# Patient Record
Sex: Female | Born: 1955 | Race: Black or African American | Hispanic: No | Marital: Married | Smoking: Never smoker
Health system: Southern US, Community
[De-identification: ages and names within clinical notes are randomized; demographics above are authoritative.]

## PROBLEM LIST (undated history)

## (undated) DIAGNOSIS — I10 Essential (primary) hypertension: Secondary | ICD-10-CM

## (undated) DIAGNOSIS — R0602 Shortness of breath: Secondary | ICD-10-CM

## (undated) DIAGNOSIS — I499 Cardiac arrhythmia, unspecified: Secondary | ICD-10-CM

## (undated) DIAGNOSIS — R002 Palpitations: Secondary | ICD-10-CM

## (undated) DIAGNOSIS — E7800 Pure hypercholesterolemia, unspecified: Secondary | ICD-10-CM

## (undated) HISTORY — DX: Essential (primary) hypertension: I10

## (undated) HISTORY — DX: Pure hypercholesterolemia, unspecified: E78.00

## (undated) HISTORY — PX: OTHER SURGICAL HISTORY: SHX169

---

## 2010-04-21 HISTORY — PX: COLONOSCOPY: SHX174

## 2010-09-20 ENCOUNTER — Ambulatory Visit: Payer: Self-pay | Admitting: Unknown Physician Specialty

## 2010-09-24 LAB — PATHOLOGY REPORT

## 2012-02-26 LAB — HM PAP SMEAR

## 2012-07-07 ENCOUNTER — Encounter: Payer: Self-pay | Admitting: *Deleted

## 2012-07-07 ENCOUNTER — Ambulatory Visit (INDEPENDENT_AMBULATORY_CARE_PROVIDER_SITE_OTHER): Payer: BC Managed Care – PPO | Admitting: Internal Medicine

## 2012-07-07 ENCOUNTER — Encounter: Payer: Self-pay | Admitting: Internal Medicine

## 2012-07-07 VITALS — BP 140/90 | HR 81 | Temp 97.9°F | Ht 66.17 in | Wt 170.2 lb

## 2012-07-07 DIAGNOSIS — R030 Elevated blood-pressure reading, without diagnosis of hypertension: Secondary | ICD-10-CM

## 2012-07-07 DIAGNOSIS — R5381 Other malaise: Secondary | ICD-10-CM

## 2012-07-07 DIAGNOSIS — R5383 Other fatigue: Secondary | ICD-10-CM

## 2012-07-07 DIAGNOSIS — E7800 Pure hypercholesterolemia, unspecified: Secondary | ICD-10-CM

## 2012-07-07 DIAGNOSIS — IMO0001 Reserved for inherently not codable concepts without codable children: Secondary | ICD-10-CM

## 2012-07-07 DIAGNOSIS — I10 Essential (primary) hypertension: Secondary | ICD-10-CM

## 2012-07-07 LAB — CBC WITH DIFFERENTIAL/PLATELET
Basophils Absolute: 0 10*3/uL (ref 0.0–0.1)
Basophils Relative: 0.8 % (ref 0.0–3.0)
Eosinophils Absolute: 0.1 10*3/uL (ref 0.0–0.7)
Eosinophils Relative: 2.5 % (ref 0.0–5.0)
HCT: 40.7 % (ref 36.0–46.0)
Hemoglobin: 13.6 g/dL (ref 12.0–15.0)
Lymphocytes Relative: 40.9 % (ref 12.0–46.0)
Lymphs Abs: 1.6 10*3/uL (ref 0.7–4.0)
MCHC: 33.6 g/dL (ref 30.0–36.0)
MCV: 79.4 fl (ref 78.0–100.0)
Monocytes Absolute: 0.3 10*3/uL (ref 0.1–1.0)
Monocytes Relative: 8.6 % (ref 3.0–12.0)
Neutro Abs: 1.9 10*3/uL (ref 1.4–7.7)
Neutrophils Relative %: 47.2 % (ref 43.0–77.0)
Platelets: 342 10*3/uL (ref 150.0–400.0)
RBC: 5.12 Mil/uL — ABNORMAL HIGH (ref 3.87–5.11)
RDW: 13.9 % (ref 11.5–14.6)
WBC: 4 10*3/uL — ABNORMAL LOW (ref 4.5–10.5)

## 2012-07-07 LAB — LIPID PANEL
Cholesterol: 243 mg/dL — ABNORMAL HIGH (ref 0–200)
HDL: 56.1 mg/dL (ref >39.00)
Total CHOL/HDL Ratio: 4
Triglycerides: 227 mg/dL — ABNORMAL HIGH (ref 0.0–149.0)
VLDL: 45.4 mg/dL — ABNORMAL HIGH (ref 0.0–40.0)

## 2012-07-07 LAB — COMPREHENSIVE METABOLIC PANEL
ALT: 26 U/L (ref 0–35)
AST: 23 U/L (ref 0–37)
Albumin: 4.4 g/dL (ref 3.5–5.2)
Alkaline Phosphatase: 90 U/L (ref 39–117)
BUN: 8 mg/dL (ref 6–23)
CO2: 27 mEq/L (ref 19–32)
Calcium: 9.3 mg/dL (ref 8.4–10.5)
Chloride: 104 mEq/L (ref 96–112)
Creatinine, Ser: 0.8 mg/dL (ref 0.4–1.2)
GFR: 83.92 mL/min (ref >60.00)
Glucose, Bld: 108 mg/dL — ABNORMAL HIGH (ref 70–99)
Potassium: 3.5 mEq/L (ref 3.5–5.1)
Sodium: 139 mEq/L (ref 135–145)
Total Bilirubin: 0.7 mg/dL (ref 0.3–1.2)
Total Protein: 8 g/dL (ref 6.0–8.3)

## 2012-07-07 LAB — LDL CHOLESTEROL, DIRECT: Direct LDL: 138.3 mg/dL

## 2012-07-07 LAB — TSH: TSH: 1.33 u[IU]/mL (ref 0.35–5.50)

## 2012-07-08 ENCOUNTER — Encounter: Payer: Self-pay | Admitting: Internal Medicine

## 2012-07-08 DIAGNOSIS — E7800 Pure hypercholesterolemia, unspecified: Secondary | ICD-10-CM | POA: Insufficient documentation

## 2012-07-08 DIAGNOSIS — I10 Essential (primary) hypertension: Secondary | ICD-10-CM | POA: Insufficient documentation

## 2012-07-08 NOTE — Assessment & Plan Note (Signed)
Low cholesterol diet and exercise.  Check lipid panel.   

## 2012-07-08 NOTE — Progress Notes (Signed)
Subjective:    Patient ID: Cheryl Powers, female    DOB: [birth year], 57 y.o.   MRN: [id number]  HPI 57 year old female who comes in today to establish care.  She has been followed by Dr Harold Hedge for years.  Now seeing Dr Luella Cook.  Needs a PCP.  Has recently been told cholesterol is elevated and has had some issues with her blood pressure being elevated.  She works at [employer].  States her job is very stressful.  She was questioning if her blood pressure could be elevated because of her work situation.  She tries to stay active, but has not been doing any formal exercise.  Breathing stable. No chest pain or tightness with increased activity or exertion.  No acid reflux.  Bowels stable.  Recently saw Dr Purcell Nails for dermatitis - breast (nipple).  This has resolved.  She does have a "rash" around her neck.  No itching.  Up to date with her pelvic and pap smears (02/26/12).  Up to date with her mammograms (02/26/12).     Outpatient Encounter Prescriptions as of 07/07/2012  Medication Sig Dispense Refill  . Biotin 1000 MCG tablet Take 1,000 mcg by mouth daily.      . Cholecalciferol (VITAMIN D3) 1000 UNITS CAPS Take by mouth daily.      . Multiple Vitamin (MULTIVITAMIN) tablet Take 1 tablet by mouth daily.       No facility-administered encounter medications on file as of 07/07/2012.    Review of Systems Patient denies any headache, lightheadedness or dizziness.  No sinus or allergy symptoms.  No chest pain, tightness or palpitations.  No increased shortness of breath, cough or congestion.  No nausea or vomiting.  No acid reflux.  No abdominal pain or cramping.  No bowel change, such as diarrhea, constipation, BRBPR or melana.  No urine change.  No vaginal problems.  The previous breast dermatitis - improved.  "rash" noted on her neck.  Increased stress.  Discussed at length with her today.  Feels she is handling things relatively well.  Does not feel she needs any further intervention at this time.   Plans to start exercising.         Objective:   Physical Exam Filed Vitals:   07/07/12 0840  BP: 140/90  Pulse: 81  Temp: 97.9 F (39.59 C)   57 year old female in no acute distress.   HEENT:  Nares- clear.  Oropharynx - without lesions. NECK:  Supple.  Nontender.  No audible bruit.  HEART:  Appears to be regular. LUNGS:  No crackles or wheezing audible.  Respirations even and unlabored.  RADIAL PULSE:  Equal bilaterally.    ABDOMEN:  Soft, nontender.  Bowel sounds present and normal.  No audible abdominal bruit.   EXTREMITIES:  No increased edema present.  DP pulses palpable and equal bilaterally.      SKIN:  Rash noted around posterior neck and extending laterally.  Appears to be c/w acanthosis nigracans.   NEURO:  No focal neurological deficit identified.        Assessment & Plan:  DERMATOLOGY.  Neck - as outlined.  May try selsun shampoo.  Check sugar.    BREAST DERMATITIS.  Followed by Dr Purcell Nails.  Improved.    INCREASED PSYCHOSOCIAL STRESSORS.  Discussed at length with her today.  She does not feel she needs any further intervention.  Will follow closely.  Let me know if any change or problems.    GYN.  Up  to date with her breast, pelvics and pap smears.  Obtain records.  Plans to start having these done through this office.   FATIGUE.  Probably multifactorial.  Check cbc, met c and tsh.    HEALTH MAINTENANCE.  Had her physical in 11/13.  Mammogram 02/26/12 - ok per pt.  Pap 02/26/12 - ok per pt.  Had colonoscopy at age [age].  Obtain records.

## 2012-07-08 NOTE — Assessment & Plan Note (Signed)
Blood pressure elevated today and apparently has been elevated on a couple of checks recently.  Will have her spot check her pressure and record.  Get her back in soon to reassess.  If persistent elevation, will require medication.  Check metabolic panel.

## 2012-07-17 ENCOUNTER — Telehealth: Payer: Self-pay | Admitting: Internal Medicine

## 2012-07-17 DIAGNOSIS — D72819 Decreased white blood cell count, unspecified: Secondary | ICD-10-CM

## 2012-07-17 DIAGNOSIS — R739 Hyperglycemia, unspecified: Secondary | ICD-10-CM

## 2012-07-17 NOTE — Telephone Encounter (Signed)
Pt notified of labs and need for follow up sugar check.  Will check at her next appt in 4/14.  Will also recheck cbc.  Send [employer].

## 2012-08-10 ENCOUNTER — Ambulatory Visit (INDEPENDENT_AMBULATORY_CARE_PROVIDER_SITE_OTHER): Payer: BC Managed Care – PPO | Admitting: Internal Medicine

## 2012-08-10 ENCOUNTER — Encounter: Payer: Self-pay | Admitting: Internal Medicine

## 2012-08-10 VITALS — BP 122/82 | HR 87 | Temp 98.4°F | Ht 66.17 in | Wt 164.8 lb

## 2012-08-10 DIAGNOSIS — E7800 Pure hypercholesterolemia, unspecified: Secondary | ICD-10-CM

## 2012-08-10 DIAGNOSIS — E0789 Other specified disorders of thyroid: Secondary | ICD-10-CM

## 2012-08-10 DIAGNOSIS — R5381 Other malaise: Secondary | ICD-10-CM

## 2012-08-10 DIAGNOSIS — D72819 Decreased white blood cell count, unspecified: Secondary | ICD-10-CM

## 2012-08-10 DIAGNOSIS — R739 Hyperglycemia, unspecified: Secondary | ICD-10-CM

## 2012-08-10 DIAGNOSIS — IMO0001 Reserved for inherently not codable concepts without codable children: Secondary | ICD-10-CM

## 2012-08-10 DIAGNOSIS — R7309 Other abnormal glucose: Secondary | ICD-10-CM

## 2012-08-10 DIAGNOSIS — R030 Elevated blood-pressure reading, without diagnosis of hypertension: Secondary | ICD-10-CM

## 2012-08-10 LAB — CBC WITH DIFFERENTIAL/PLATELET
Basophils Absolute: 0 10*3/uL (ref 0.0–0.1)
Basophils Relative: 0.8 % (ref 0.0–3.0)
Eosinophils Absolute: 0.1 10*3/uL (ref 0.0–0.7)
Eosinophils Relative: 2.2 % (ref 0.0–5.0)
HCT: 40.1 % (ref 36.0–46.0)
Hemoglobin: 13.6 g/dL (ref 12.0–15.0)
Lymphocytes Relative: 41.3 % (ref 12.0–46.0)
Lymphs Abs: 1.7 10*3/uL (ref 0.7–4.0)
MCHC: 34 g/dL (ref 30.0–36.0)
MCV: 78.7 fl (ref 78.0–100.0)
Monocytes Absolute: 0.3 10*3/uL (ref 0.1–1.0)
Monocytes Relative: 7.3 % (ref 3.0–12.0)
Neutro Abs: 1.9 10*3/uL (ref 1.4–7.7)
Neutrophils Relative %: 48.4 % (ref 43.0–77.0)
Platelets: 328 10*3/uL (ref 150.0–400.0)
RBC: 5.09 Mil/uL (ref 3.87–5.11)
RDW: 12.9 % (ref 11.5–14.6)
WBC: 4 10*3/uL — ABNORMAL LOW (ref 4.5–10.5)

## 2012-08-10 LAB — GLUCOSE, RANDOM: Glucose, Bld: 100 mg/dL — ABNORMAL HIGH (ref 70–99)

## 2012-08-10 LAB — HEMOGLOBIN A1C: Hgb A1c MFr Bld: 6 % (ref 4.6–6.5)

## 2012-08-10 MED ORDER — CYANOCOBALAMIN 1000 MCG/ML IJ SOLN
1000.0000 ug | Freq: Once | INTRAMUSCULAR | Status: AC
  Administered 2012-08-10: 1000 ug via INTRAMUSCULAR

## 2012-08-16 ENCOUNTER — Ambulatory Visit: Payer: Self-pay | Admitting: Internal Medicine

## 2012-08-16 ENCOUNTER — Encounter: Payer: Self-pay | Admitting: Internal Medicine

## 2012-08-16 DIAGNOSIS — E0789 Other specified disorders of thyroid: Secondary | ICD-10-CM | POA: Insufficient documentation

## 2012-08-16 NOTE — Assessment & Plan Note (Signed)
Exam as outlined.  Check thyroid ultrasound.  Recent tsh wnl.

## 2012-08-16 NOTE — Progress Notes (Signed)
Subjective:    Patient ID: Cheryl Powers, female    DOB: 08-Sep-1955, 57 y.o.   MRN: [id number]  HPI 57 year old female who comes in today for a scheduled follow up.   She works at [employer].  Her job is very stressful.  Since her last visit, she has adjusted her diet.  Is losing weight and exercising.  Doing Zoomba and palates.  Also walking 30 minutes per day.  Breathing stable. No chest pain or tightness with increased activity or exertion.  No acid reflux.  Bowels stable.   Feels better.   She does have a "rash" around her neck.  No itching.  Up to date with her pelvic and pap smears (02/26/12).  Up to date with her mammograms (02/26/12).     Outpatient Encounter Prescriptions as of 08/10/2012  Medication Sig Dispense Refill  . Biotin 1000 MCG tablet Take 1,000 mcg by mouth every other day.       . Cholecalciferol (VITAMIN D3) 1000 UNITS CAPS Take by mouth daily.      . fish oil-omega-3 fatty acids 1000 MG capsule Take 2 g by mouth every other day.      . [DISCONTINUED] Multiple Vitamin (MULTIVITAMIN) tablet Take 1 tablet by mouth daily.      . [EXPIRED] cyanocobalamin ((VITAMIN B-12)) injection 1,000 mcg        No facility-administered encounter medications on file as of 08/10/2012.    Current Outpatient Prescriptions on File Prior to Visit  Medication Sig Dispense Refill  . Biotin 1000 MCG tablet Take 1,000 mcg by mouth every other day.       . Cholecalciferol (VITAMIN D3) 1000 UNITS CAPS Take by mouth daily.       No current facility-administered medications on file prior to visit.    Review of Systems Patient denies any headache, lightheadedness or dizziness.  No sinus or allergy symptoms.  No chest pain, tightness or palpitations.  No increased shortness of breath, cough or congestion.  No nausea or vomiting.  No acid reflux.  No abdominal pain or cramping.  No bowel change, such as diarrhea, constipation, BRBPR or melana.  No urine change.  No vaginal problems.   "Rash" noted on  her neck.  Has not tried selsun.  Increased stress.  Feels she is handling things relatively well.  Does not feel she needs any further intervention at this time.  Exercising has helped.         Objective:   Physical Exam  Filed Vitals:   08/10/12 0806  BP: 122/82  Pulse: 87  Temp: 98.4 F (36.9 C)   Blood pressure recheck:  32/14  57 year old female in no acute distress.   HEENT:  Nares- clear.  Oropharynx - without lesions. NECK:  Supple.  Nontender.  No audible bruit.  Increased fullness - thyroid.    HEART:  Appears to be regular. LUNGS:  No crackles or wheezing audible.  Respirations even and unlabored.  RADIAL PULSE:  Equal bilaterally.    ABDOMEN:  Soft, nontender.  Bowel sounds present and normal.  No audible abdominal bruit.   EXTREMITIES:  No increased edema present.  DP pulses palpable and equal bilaterally.      SKIN:  Rash noted around posterior neck and extending laterally.  Appears to be c/w acanthosis nigracans.       Assessment & Plan:  DERMATOLOGY.  Neck - as outlined.  May try selsun shampoo.  If persistent, will refer back to dermatology.  BREAST DERMATITIS.  Followed by Dr Purcell Nails.  Improved.    INCREASED PSYCHOSOCIAL STRESSORS.  She does not feel she needs any further intervention.  Since exercising, feels better.  Follow.   GYN.  Up to date with her breast, pelvics and pap smears.  Pap negative 02/26/12.  Mammogram 02/26/12 - negative.   HEALTH MAINTENANCE.  Had her physical in 11/13.  Mammogram 02/26/12 - negative.  Pap 02/26/12 - ok per pt.  Had colonoscopy at age 98-53.  Need records.

## 2012-08-16 NOTE — Assessment & Plan Note (Signed)
Low cholesterol diet and exercise.  Follow lipid panel.   

## 2012-08-16 NOTE — Assessment & Plan Note (Signed)
Blood pressure better.  Follow. Has adjusted her diet.  Exercising.  Losing weight.  Follow.

## 2012-08-31 ENCOUNTER — Telehealth: Payer: Self-pay | Admitting: Internal Medicine

## 2012-08-31 DIAGNOSIS — E041 Nontoxic single thyroid nodule: Secondary | ICD-10-CM

## 2012-08-31 NOTE — Telephone Encounter (Signed)
Had tried multiple times to get in touch with pt.  She was notified today of thyroid ultrasound results and need for referral to endocrinology (Dr Renae Fickle ) for evaluation and question of need for biopsy.  Order for referral made

## 2012-08-31 NOTE — Telephone Encounter (Signed)
Patient wanting to know the results of her thyroid test. She is asking for a call from the physician.

## 2012-09-23 ENCOUNTER — Encounter: Payer: Self-pay | Admitting: Internal Medicine

## 2012-10-11 ENCOUNTER — Ambulatory Visit: Payer: BC Managed Care – PPO | Admitting: Internal Medicine

## 2013-05-23 ENCOUNTER — Emergency Department: Payer: Self-pay | Admitting: Emergency Medicine

## 2013-05-23 LAB — CBC
HCT: 41.1 % (ref 35.0–47.0)
HGB: 13.6 g/dL (ref 12.0–16.0)
MCH: 26.6 pg (ref 26.0–34.0)
MCHC: 33.1 g/dL (ref 32.0–36.0)
MCV: 80 fL (ref 80–100)
Platelet: 295 10*3/uL (ref 150–440)
RBC: 5.11 10*6/uL (ref 3.80–5.20)
RDW: 12.9 % (ref 11.5–14.5)
WBC: 5.5 10*3/uL (ref 3.6–11.0)

## 2013-05-23 LAB — COMPREHENSIVE METABOLIC PANEL
Albumin: 3.9 g/dL (ref 3.4–5.0)
Alkaline Phosphatase: 110 U/L
Anion Gap: 6 — ABNORMAL LOW (ref 7–16)
BUN: 15 mg/dL (ref 7–18)
Bilirubin,Total: 0.2 mg/dL (ref 0.2–1.0)
Calcium, Total: 8.8 mg/dL (ref 8.5–10.1)
Chloride: 104 mmol/L (ref 98–107)
Co2: 28 mmol/L (ref 21–32)
Creatinine: 0.85 mg/dL (ref 0.60–1.30)
EGFR (African American): >60
EGFR (Non-African Amer.): >60
Glucose: 129 mg/dL — ABNORMAL HIGH (ref 65–99)
Osmolality: 278 (ref 275–301)
Potassium: 3 mmol/L — ABNORMAL LOW (ref 3.5–5.1)
SGOT(AST): 26 U/L (ref 15–37)
SGPT (ALT): 27 U/L (ref 12–78)
Sodium: 138 mmol/L (ref 136–145)
Total Protein: 8.1 g/dL (ref 6.4–8.2)

## 2013-05-23 LAB — ETHANOL
Ethanol %: <0.003 % (ref 0.000–0.080)
Ethanol: <3 mg/dL

## 2013-05-23 LAB — URINALYSIS, COMPLETE
Bilirubin,UR: NEGATIVE
Blood: NEGATIVE
Glucose,UR: NEGATIVE mg/dL (ref 0–75)
Ketone: NEGATIVE
Nitrite: NEGATIVE
Ph: 7 (ref 4.5–8.0)
Protein: NEGATIVE
RBC,UR: NONE SEEN /HPF (ref 0–5)
Specific Gravity: 1.008 (ref 1.003–1.030)
Squamous Epithelial: <1
WBC UR: 10 /HPF (ref 0–5)

## 2013-05-23 LAB — DRUG SCREEN, URINE

## 2013-05-23 LAB — CK TOTAL AND CKMB (NOT AT ARMC)
CK, Total: 178 U/L (ref 21–215)
CK-MB: 1.5 ng/mL (ref 0.5–3.6)

## 2013-05-23 LAB — LIPASE, BLOOD: Lipase: 213 U/L (ref 73–393)

## 2013-05-23 LAB — TSH: Thyroid Stimulating Horm: 3.38 u[IU]/mL

## 2013-05-23 LAB — TROPONIN I: Troponin-I: <0.02 ng/mL

## 2013-05-25 ENCOUNTER — Ambulatory Visit (INDEPENDENT_AMBULATORY_CARE_PROVIDER_SITE_OTHER): Payer: BC Managed Care – PPO | Admitting: Internal Medicine

## 2013-05-25 ENCOUNTER — Encounter: Payer: Self-pay | Admitting: Internal Medicine

## 2013-05-25 VITALS — BP 140/100 | HR 84 | Temp 98.7°F | Ht 66.17 in | Wt 152.0 lb

## 2013-05-25 DIAGNOSIS — E876 Hypokalemia: Secondary | ICD-10-CM

## 2013-05-25 DIAGNOSIS — I10 Essential (primary) hypertension: Secondary | ICD-10-CM

## 2013-05-25 DIAGNOSIS — N390 Urinary tract infection, site not specified: Secondary | ICD-10-CM

## 2013-05-25 DIAGNOSIS — E7800 Pure hypercholesterolemia, unspecified: Secondary | ICD-10-CM

## 2013-05-25 LAB — POTASSIUM: Potassium: 4.1 mEq/L (ref 3.5–5.1)

## 2013-05-25 LAB — CREATININE, SERUM: Creatinine, Ser: 1.2 mg/dL (ref 0.4–1.2)

## 2013-05-25 MED ORDER — FLUTICASONE PROPIONATE 50 MCG/ACT NA SUSP: 2.0000 | Freq: Every day | NASAL | Status: DC

## 2013-05-25 MED ORDER — LISINOPRIL 10 MG PO TABS: 10.0000 mg | ORAL_TABLET | Freq: Every day | ORAL | Status: DC

## 2013-05-25 NOTE — Progress Notes (Signed)
Pre-visit discussion using our clinic review tool. No additional management support is needed unless otherwise documented below in the visit note.  

## 2013-05-25 NOTE — Patient Instructions (Signed)
Saline nasal spray - flush nose at least 2-3x/day.  Flonase nasal spray - two sprays each nostril one time per day (do this in the evening).    Start lisinopril 10mg  - one per day.    Stop the advil cold and sinus.    Take align one per day (probiotic)

## 2013-05-26 ENCOUNTER — Other Ambulatory Visit: Payer: Self-pay | Admitting: Internal Medicine

## 2013-05-26 DIAGNOSIS — I10 Essential (primary) hypertension: Secondary | ICD-10-CM

## 2013-05-26 NOTE — Progress Notes (Signed)
Order placed for f/u met b.  

## 2013-05-30 ENCOUNTER — Encounter: Payer: Self-pay | Admitting: Internal Medicine

## 2013-05-30 DIAGNOSIS — N289 Disorder of kidney and ureter, unspecified: Secondary | ICD-10-CM | POA: Insufficient documentation

## 2013-05-30 NOTE — Assessment & Plan Note (Signed)
Blood pressure remaining elevated.  Start lisinopril 10mg  q day.  Check metabolic panel in two weeks.  Follow pressures.

## 2013-05-30 NOTE — Assessment & Plan Note (Signed)
On cipro for uti.  Symptoms have resolved.  Follow.

## 2013-05-30 NOTE — Progress Notes (Signed)
Subjective:    Patient ID: Cheryl Powers, female    DOB: Oct 05, 1955, 58 y.o.   MRN: [id number]  HPI 58 year old female who comes in today for an ER follow up.  She works at [employer].  Her job is very stressful. prior to her ER visit, she noticed some increased fatigue.  Also noticed some low back pain with minimal dysuria.  later developed some stomach swelling.  Experienced an increased heart rate and intermittent palpitations.  To ER.  Blood pressure 174/90-120.  Had EKG, labs and urine.  Diagnosed with a UTI.  Potassium low.  Was placed on cipro and states she was told the episode could be related to anxiety.  Breathing stable. No chest pain or tightness with increased activity or exertion.  No acid reflux.  Some loose stool.  May have two bowel movements per day.  Feels better.       Outpatient Encounter Prescriptions as of 05/25/2013  Medication Sig  . Biotin 1000 MCG tablet Take 1,000 mcg by mouth every other day.   . Cholecalciferol (VITAMIN D3) 1000 UNITS CAPS Take by mouth daily.  . fish oil-omega-3 fatty acids 1000 MG capsule Take 2 g by mouth every other day.  . fluticasone (FLONASE) 50 MCG/ACT nasal spray Place 2 sprays into both nostrils daily.  Marland Kitchen lisinopril (PRINIVIL,ZESTRIL) 10 MG tablet Take 1 tablet (10 mg total) by mouth daily.    Current Outpatient Prescriptions on File Prior to Visit  Medication Sig Dispense Refill  . Biotin 1000 MCG tablet Take 1,000 mcg by mouth every other day.       . Cholecalciferol (VITAMIN D3) 1000 UNITS CAPS Take by mouth daily.      . fish oil-omega-3 fatty acids 1000 MG capsule Take 2 g by mouth every other day.       No current facility-administered medications on file prior to visit.    Review of Systems Patient denies any headache, lightheadedness or dizziness.  No sinus or allergy symptoms.  No chest pain, tightness or palpitations currently.  Previous rapid heart beat prior to ER visit.  No increased shortness of breath, cough or  congestion.  No nausea or vomiting.  Eating and drinking.  No acid reflux.  No abdominal pain or cramping.  No constipation, BRBPR or melana.  No urine change now.  The previous dysuria has resolved.  No vaginal problems.          Objective:   Physical Exam  Filed Vitals:   05/25/13 0928  BP: 140/100  Pulse: 84  Temp: 98.7 F (37.1 C)   Blood pressure recheck:  12/32  58 year old female in no acute distress.   HEENT:  Nares- clear.  Oropharynx - without lesions. NECK:  Supple.  Nontender.  No audible bruit.  Increased fullness - thyroid.    HEART:  Appears to be regular. LUNGS:  No crackles or wheezing audible.  Respirations even and unlabored.  RADIAL PULSE:  Equal bilaterally.    ABDOMEN:  Soft, nontender.  Bowel sounds present and normal.  No audible abdominal bruit.   EXTREMITIES:  No increased edema present.  DP pulses palpable and equal bilaterally.      SKIN:  Rash noted around posterior neck and extending laterally.  Appears to be c/w acanthosis nigracans.       Assessment & Plan:  INCREASED PSYCHOSOCIAL STRESSORS.  She does not feel she needs any further intervention.  Since exercising, feels better.  Follow.   GYN.  Up to date with her breast, pelvics and pap smears.  Pap negative 02/26/12.  Mammogram 02/26/12 - negative. Needs f/u mammogram.   CARDIOVASCULAR.  Currently asymptomatic.  Follow.     HEALTH MAINTENANCE.  Had her physical in 11/13.  Mammogram 02/26/12 - negative.  Pap 02/26/12 - ok per pt.  Had colonoscopy at age [age].  Needs physical.

## 2013-05-30 NOTE — Assessment & Plan Note (Signed)
Low cholesterol diet and exercise.  Follow lipid panel.   

## 2013-05-30 NOTE — Assessment & Plan Note (Signed)
Given replacement in the ER.  Recheck potassium today.

## 2013-05-31 ENCOUNTER — Other Ambulatory Visit (INDEPENDENT_AMBULATORY_CARE_PROVIDER_SITE_OTHER): Payer: BC Managed Care – PPO

## 2013-05-31 DIAGNOSIS — I10 Essential (primary) hypertension: Secondary | ICD-10-CM

## 2013-05-31 LAB — BASIC METABOLIC PANEL
BUN: 17 mg/dL (ref 6–23)
CO2: 28 mEq/L (ref 19–32)
Calcium: 9.2 mg/dL (ref 8.4–10.5)
Chloride: 102 mEq/L (ref 96–112)
Creatinine, Ser: 1.3 mg/dL — ABNORMAL HIGH (ref 0.4–1.2)
GFR: 47.1 mL/min — ABNORMAL LOW (ref >60.00)
Glucose, Bld: 79 mg/dL (ref 70–99)
Potassium: 4.3 mEq/L (ref 3.5–5.1)
Sodium: 139 mEq/L (ref 135–145)

## 2013-06-03 ENCOUNTER — Other Ambulatory Visit: Payer: Self-pay | Admitting: Internal Medicine

## 2013-06-03 DIAGNOSIS — N289 Disorder of kidney and ureter, unspecified: Secondary | ICD-10-CM

## 2013-06-03 NOTE — Progress Notes (Signed)
Orders placed for labs

## 2013-06-09 ENCOUNTER — Other Ambulatory Visit (INDEPENDENT_AMBULATORY_CARE_PROVIDER_SITE_OTHER): Payer: BC Managed Care – PPO

## 2013-06-09 DIAGNOSIS — N289 Disorder of kidney and ureter, unspecified: Secondary | ICD-10-CM

## 2013-06-09 LAB — BASIC METABOLIC PANEL
BUN: 15 mg/dL (ref 6–23)
CO2: 27 mEq/L (ref 19–32)
Calcium: 9.6 mg/dL (ref 8.4–10.5)
Chloride: 102 mEq/L (ref 96–112)
Creatinine, Ser: 1.1 mg/dL (ref 0.4–1.2)
GFR: 56.97 mL/min — ABNORMAL LOW (ref >60.00)
Glucose, Bld: 72 mg/dL (ref 70–99)
Potassium: 4.5 mEq/L (ref 3.5–5.1)
Sodium: 137 mEq/L (ref 135–145)

## 2013-06-09 LAB — URINALYSIS, ROUTINE W REFLEX MICROSCOPIC
Bilirubin Urine: NEGATIVE
Hgb urine dipstick: NEGATIVE
Ketones, ur: NEGATIVE
Leukocytes, UA: NEGATIVE
Nitrite: NEGATIVE
Specific Gravity, Urine: <=1.005 — AB (ref 1.000–1.030)
Total Protein, Urine: NEGATIVE
Urine Glucose: NEGATIVE
Urobilinogen, UA: 0.2 (ref 0.0–1.0)
pH: 6.5 (ref 5.0–8.0)

## 2013-06-10 ENCOUNTER — Encounter: Payer: Self-pay | Admitting: *Deleted

## 2013-06-15 ENCOUNTER — Ambulatory Visit (INDEPENDENT_AMBULATORY_CARE_PROVIDER_SITE_OTHER): Payer: BC Managed Care – PPO | Admitting: Internal Medicine

## 2013-06-15 ENCOUNTER — Encounter: Payer: Self-pay | Admitting: Internal Medicine

## 2013-06-15 VITALS — BP 130/80 | HR 78 | Temp 98.1°F | Ht 66.5 in | Wt 152.8 lb

## 2013-06-15 DIAGNOSIS — E7800 Pure hypercholesterolemia, unspecified: Secondary | ICD-10-CM

## 2013-06-15 DIAGNOSIS — N289 Disorder of kidney and ureter, unspecified: Secondary | ICD-10-CM

## 2013-06-15 DIAGNOSIS — R739 Hyperglycemia, unspecified: Secondary | ICD-10-CM

## 2013-06-15 DIAGNOSIS — R9431 Abnormal electrocardiogram [ECG] [EKG]: Secondary | ICD-10-CM

## 2013-06-15 DIAGNOSIS — I10 Essential (primary) hypertension: Secondary | ICD-10-CM

## 2013-06-15 DIAGNOSIS — Z1239 Encounter for other screening for malignant neoplasm of breast: Secondary | ICD-10-CM

## 2013-06-15 DIAGNOSIS — R7309 Other abnormal glucose: Secondary | ICD-10-CM

## 2013-06-15 DIAGNOSIS — E0789 Other specified disorders of thyroid: Secondary | ICD-10-CM

## 2013-06-16 ENCOUNTER — Encounter: Payer: Self-pay | Admitting: Internal Medicine

## 2013-06-16 NOTE — Progress Notes (Addendum)
Subjective:    Patient ID: Cheryl Powers, female    DOB: 03-29-1956, 58 y.o.   MRN: [id number]  HPI 59 year old female with past history of hypertension, hyperglycemia and hypercholesterolemia who comes in today for a scheduled follow up.   She works at [employer].  Her job is very stressful.   She also reports her increased stress with her current relationship.  She has recently been waking up with hearing her heart beat.  Some increased anxiety. recently evaluated in the ER.  Had EKG changes then.  Comes in today to follow up regarding her blood pressure and her anxiety. Breathing appears to be relatively stable. No chest pain or tightness with increased activity or exertion.  No acid reflux.  Blood pressure better.        Outpatient Encounter Prescriptions as of 06/15/2013  Medication Sig  . fish oil-omega-3 fatty acids 1000 MG capsule Take 2 g by mouth every other day.  . fluticasone (FLONASE) 50 MCG/ACT nasal spray Place 2 sprays into both nostrils daily.  Marland Kitchen lisinopril (PRINIVIL,ZESTRIL) 10 MG tablet Take 1 tablet (10 mg total) by mouth daily.  . Biotin 1000 MCG tablet Take 1,000 mcg by mouth every other day.   . Cholecalciferol (VITAMIN D3) 1000 UNITS CAPS Take by mouth daily.    Current Outpatient Prescriptions on File Prior to Visit  Medication Sig Dispense Refill  . fish oil-omega-3 fatty acids 1000 MG capsule Take 2 g by mouth every other day.      . fluticasone (FLONASE) 50 MCG/ACT nasal spray Place 2 sprays into both nostrils daily.  16 g  1  . lisinopril (PRINIVIL,ZESTRIL) 10 MG tablet Take 1 tablet (10 mg total) by mouth daily.  30 tablet  1  . Biotin 1000 MCG tablet Take 1,000 mcg by mouth every other day.       . Cholecalciferol (VITAMIN D3) 1000 UNITS CAPS Take by mouth daily.       No current facility-administered medications on file prior to visit.    Review of Systems Patient denies any headache, lightheadedness or dizziness.  No sinus or allergy symptoms.  No chest  pain, tightness or palpitations currently.  Previous rapid heart beat prior to ER visit.  EKG with some changes.  No increased shortness of breath, cough or congestion.  No nausea or vomiting.  Eating and drinking.  No acid reflux.  No abdominal pain or cramping.  No constipation, BRBPR or melana.  No urine change now.   No vaginal problems.           Objective:   Physical Exam  Filed Vitals:   06/15/13 1222  BP: 130/80  Pulse: 78  Temp: 98.1 F (36.7 C)   Blood pressure recheck:  67/7  58 year old female in no acute distress.   HEENT:  Nares- clear.  Oropharynx - without lesions. NECK:  Supple.  Nontender.  No audible bruit.  Increased fullness - thyroid.    HEART:  Appears to be regular. LUNGS:  No crackles or wheezing audible.  Respirations even and unlabored.  RADIAL PULSE:  Equal bilaterally.    ABDOMEN:  Soft, nontender.  Bowel sounds present and normal.  No audible abdominal bruit.   EXTREMITIES:  No increased edema present.  DP pulses palpable and equal bilaterally.         Assessment & Plan:  INCREASED PSYCHOSOCIAL STRESSORS.  She does not feel she needs any further intervention at this time.  Discussed at length  with her today.  Will follow.    GYN.  Up to date with her breast, pelvics and pap smears.  Pap negative 02/26/12.  Mammogram 02/26/12 - negative. Needs f/u mammogram.  Schedule.  Schedule a physical here for next visit.    CARDIOVASCULAR.  Previous increased heart rate and EKG changes as outlined.  Repeat EKG today revealed SR with no acute ischemic changes.  Given the previous abnormal EKG in the setting of the increased heart rate, etc and her risk factors- I would like to check a stress echo to confirm no active   Addendum:  Reviewed ER records.  EKG revealed inferior ST depression and TWI with tachycardia.  Pt had abdominal symptoms (discomfort and fullness) with EKG changes.  F/u EKG (when pts symptoms had improved) - revealed resolution of the EKG changes.   Given these findings of inferior EKG changes with symptoms, I do feel further cardiac w/up is warranted.  Will obtain stress echo to confirm no active ischemia.        HEALTH MAINTENANCE.  Had her physical in 11/13.  Mammogram 02/26/12 - negative.  Overdue mammogram.  Schedule.  Pap 02/26/12 - ok per pt.  Had colonoscopy at age [age].  Needs physical.  Schedule for next visit.

## 2013-06-16 NOTE — Assessment & Plan Note (Signed)
Low cholesterol diet and exercise.  Follow lipid panel.  Recheck today.  °

## 2013-06-16 NOTE — Assessment & Plan Note (Signed)
Blood pressure doing better on Lisinopril.  Follow.

## 2013-06-16 NOTE — Assessment & Plan Note (Signed)
Exam as outlined.  Thyroid ultrasound revealed multiple nodules.  Referred to Dr Eddie Dibbles for endocrinology.

## 2013-06-16 NOTE — Assessment & Plan Note (Signed)
Most recent Cr improved.- 1.1.  Follow.  Stay hydrated.  Urinalysis negative.

## 2013-06-30 ENCOUNTER — Telehealth: Payer: Self-pay | Admitting: Internal Medicine

## 2013-06-30 NOTE — Telephone Encounter (Signed)
Peter Congo from [employer] called and states that the stress echo had been denied and they needed documentation faxed over to them for clinical review. I have faxed over last office note, last EKG and order for the stress test. She states that we should hear something back from them by Monday 07/04/13. If dr. Nicki Reaper would like to review there clinical guidelines you can go to [email or web address]

## 2013-07-01 NOTE — Telephone Encounter (Signed)
Noted  

## 2013-07-04 NOTE — Telephone Encounter (Signed)
[facility] has auth this exam for pt. Auth # [id number]. I have LVM for patient to call our office in reference to apt scheduled on 07/15/13 at 10am with [place].

## 2013-07-04 NOTE — Telephone Encounter (Signed)
Thanks.  Let me know if we need to do anything.

## 2013-07-04 NOTE — Telephone Encounter (Signed)
Pt aware of apt but is r/s due to stressful work schedule.

## 2013-07-13 ENCOUNTER — Other Ambulatory Visit (INDEPENDENT_AMBULATORY_CARE_PROVIDER_SITE_OTHER): Payer: BC Managed Care – PPO

## 2013-07-13 ENCOUNTER — Encounter (INDEPENDENT_AMBULATORY_CARE_PROVIDER_SITE_OTHER): Payer: Self-pay

## 2013-07-13 DIAGNOSIS — R7309 Other abnormal glucose: Secondary | ICD-10-CM

## 2013-07-13 DIAGNOSIS — N289 Disorder of kidney and ureter, unspecified: Secondary | ICD-10-CM

## 2013-07-13 DIAGNOSIS — R739 Hyperglycemia, unspecified: Secondary | ICD-10-CM

## 2013-07-13 DIAGNOSIS — E7800 Pure hypercholesterolemia, unspecified: Secondary | ICD-10-CM

## 2013-07-13 LAB — COMPREHENSIVE METABOLIC PANEL
ALT: 17 U/L (ref 0–35)
AST: 20 U/L (ref 0–37)
Albumin: 4.4 g/dL (ref 3.5–5.2)
Alkaline Phosphatase: 74 U/L (ref 39–117)
BUN: 14 mg/dL (ref 6–23)
CO2: 29 mEq/L (ref 19–32)
Calcium: 9.6 mg/dL (ref 8.4–10.5)
Chloride: 105 mEq/L (ref 96–112)
Creatinine, Ser: 1 mg/dL (ref 0.4–1.2)
GFR: 63.85 mL/min (ref >60.00)
Glucose, Bld: 99 mg/dL (ref 70–99)
Potassium: 3.9 mEq/L (ref 3.5–5.1)
Sodium: 140 mEq/L (ref 135–145)
Total Bilirubin: 0.8 mg/dL (ref 0.3–1.2)
Total Protein: 7.3 g/dL (ref 6.0–8.3)

## 2013-07-13 LAB — LIPID PANEL
Cholesterol: 238 mg/dL — ABNORMAL HIGH (ref 0–200)
HDL: 60 mg/dL (ref >39.00)
LDL Cholesterol: 153 mg/dL — ABNORMAL HIGH (ref 0–99)
Total CHOL/HDL Ratio: 4
Triglycerides: 127 mg/dL (ref 0.0–149.0)
VLDL: 25.4 mg/dL (ref 0.0–40.0)

## 2013-07-13 LAB — HEMOGLOBIN A1C: Hgb A1c MFr Bld: 5.9 % (ref 4.6–6.5)

## 2013-07-15 ENCOUNTER — Other Ambulatory Visit: Payer: BC Managed Care – PPO

## 2013-07-21 ENCOUNTER — Other Ambulatory Visit: Payer: Self-pay | Admitting: Internal Medicine

## 2013-07-28 ENCOUNTER — Telehealth: Payer: Self-pay | Admitting: Internal Medicine

## 2013-08-03 LAB — HM MAMMOGRAPHY

## 2013-08-30 ENCOUNTER — Encounter (INDEPENDENT_AMBULATORY_CARE_PROVIDER_SITE_OTHER): Payer: Self-pay

## 2013-08-30 ENCOUNTER — Ambulatory Visit (INDEPENDENT_AMBULATORY_CARE_PROVIDER_SITE_OTHER): Payer: BC Managed Care – PPO | Admitting: Internal Medicine

## 2013-08-30 ENCOUNTER — Encounter: Payer: Self-pay | Admitting: Internal Medicine

## 2013-08-30 VITALS — HR 68 | Temp 98.3°F | Ht 66.5 in | Wt 147.0 lb

## 2013-08-30 DIAGNOSIS — Z860100 Personal history of colon polyps, unspecified: Secondary | ICD-10-CM

## 2013-08-30 DIAGNOSIS — I10 Essential (primary) hypertension: Secondary | ICD-10-CM

## 2013-08-30 DIAGNOSIS — N289 Disorder of kidney and ureter, unspecified: Secondary | ICD-10-CM

## 2013-08-30 DIAGNOSIS — E0789 Other specified disorders of thyroid: Secondary | ICD-10-CM

## 2013-08-30 DIAGNOSIS — E7800 Pure hypercholesterolemia, unspecified: Secondary | ICD-10-CM

## 2013-08-30 LAB — CBC WITH DIFFERENTIAL/PLATELET
Basophils Absolute: 0 10*3/uL (ref 0.0–0.1)
Basophils Relative: 0.7 % (ref 0.0–3.0)
Eosinophils Absolute: 0 10*3/uL (ref 0.0–0.7)
Eosinophils Relative: 1.2 % (ref 0.0–5.0)
HCT: 36.1 % (ref 36.0–46.0)
Hemoglobin: 12.2 g/dL (ref 12.0–15.0)
Lymphocytes Relative: 44 % (ref 12.0–46.0)
Lymphs Abs: 1.8 10*3/uL (ref 0.7–4.0)
MCHC: 33.7 g/dL (ref 30.0–36.0)
MCV: 81.8 fl (ref 78.0–100.0)
Monocytes Absolute: 0.3 10*3/uL (ref 0.1–1.0)
Monocytes Relative: 7.9 % (ref 3.0–12.0)
Neutro Abs: 1.9 10*3/uL (ref 1.4–7.7)
Neutrophils Relative %: 46.2 % (ref 43.0–77.0)
Platelets: 296 10*3/uL (ref 150.0–400.0)
RBC: 4.42 Mil/uL (ref 3.87–5.11)
RDW: 13.8 % (ref 11.5–15.5)
WBC: 4 10*3/uL (ref 4.0–10.5)

## 2013-08-30 LAB — TSH: TSH: 0.93 u[IU]/mL (ref 0.35–4.50)

## 2013-08-30 NOTE — Progress Notes (Signed)
Subjective:    Patient ID: Cheryl Powers, female    DOB: 1955/08/29, 58 y.o.   MRN: [id number]  HPI 58 year old female with past history of hypertension, hyperglycemia and hypercholesterolemia who comes in today to follow up on these issues as well as for a complete physical exam.  She works at [employer].  Her job is very stressful.   She also reports her increased stress with her current relationship.  Some increased anxiety. recently evaluated in the ER.  Had EKG changes then.  Comes in today to follow up regarding her blood pressure and her anxiety. Breathing appears to be relatively stable. No chest pain or tightness with increased activity or exertion.  No acid reflux.  Blood pressure better.  She declined further cardiology evaluation.  Has modified her diet.  [place]alking [place] the mall.  Has lost weight.  Overall physically feels better.        Outpatient Encounter Prescriptions as of 08/30/2013  Medication Sig  . Biotin 1000 MCG tablet Take 1,000 mcg by mouth every other day.   . Cholecalciferol (VITAMIN D3) 1000 UNITS CAPS Take by mouth daily.  . fish oil-omega-3 fatty acids 1000 MG capsule Take 2 g by mouth every other day.  . fluticasone (FLONASE) 50 MCG/ACT nasal spray Place 2 sprays into both nostrils daily.  Marland Kitchen lisinopril (PRINIVIL,ZESTRIL) 10 MG tablet TAKE ONE TABLET BY MOUTH ONCE DAILY    Current Outpatient Prescriptions on File Prior to Visit  Medication Sig Dispense Refill  . Biotin 1000 MCG tablet Take 1,000 mcg by mouth every other day.       . Cholecalciferol (VITAMIN D3) 1000 UNITS CAPS Take by mouth daily.      . fish oil-omega-3 fatty acids 1000 MG capsule Take 2 g by mouth every other day.      . fluticasone (FLONASE) 50 MCG/ACT nasal spray Place 2 sprays into both nostrils daily.  16 g  1  . lisinopril (PRINIVIL,ZESTRIL) 10 MG tablet TAKE ONE TABLET BY MOUTH ONCE DAILY  30 tablet  5   No current facility-administered medications on file prior to visit.    Review of  Systems Patient denies any headache, lightheadedness or dizziness.  No sinus or allergy symptoms.  No chest pain, tightness or palpitations currently.  Previous rapid heart beat prior to ER visit.  EKG with some changes.  No increased shortness of breath, cough or congestion.  No nausea or vomiting.  Eating and drinking.  No acid reflux.  No abdominal pain or cramping.  No constipation, BRBPR or melana.  No urine change now.   No vaginal problems.  Has adjusted her diet.  Walking.  Overall feels better.      Objective:   Physical Exam  Filed Vitals:   08/30/13 0942  BP: 130/90  Pulse: 68  Temp: 98.3 F (36.8 C)   Blood pressure recheck:  63/70  58 year old female in no acute distress.   HEENT:  Nares- clear.  Oropharynx - without lesions. NECK:  Supple.  Nontender.  No audible bruit.  HEART:  Appears to be regular. LUNGS:  No crackles or wheezing audible.  Respirations even and unlabored.  RADIAL PULSE:  Equal bilaterally.    BREASTS:  No nipple discharge or nipple retraction present.  Could not appreciate any distinct nodules or axillary adenopathy.  ABDOMEN:  Soft, nontender.  Bowel sounds present and normal.  No audible abdominal bruit.  GU:  Not performed.   EXTREMITIES:  No increased  edema present.  DP pulses palpable and equal bilaterally.      Assessment & Plan:  INCREASED PSYCHOSOCIAL STRESSORS.  She does not feel she needs any further intervention at this time.  Discussed at length with her today.  Will follow.    GYN.  Up to date with her breast, pelvics and pap smears.  Pap negative 02/26/12.  Mammogram 02/26/12 - negative.  Had f/u mammogram at [facility].  Obtain results.      CARDIOVASCULAR.  Previous increased heart rate and EKG changes as outlined.  Repeat EKG as outlined in last note. Given the previous abnormal EKG in the setting of the increased heart rate, etc and her risk factors, she was referred for a stress echo to confirm no ischemia.  She declined stress  testing and does not want to schedule.    HEALTH MAINTENANCE.  Physical today.  Mammogram 02/26/12 - negative.  States she had a f/u mammogram at [facility].  Obtain results.   Pap 02/26/12 - ok per pt.  Had colonoscopy at age [age].   I spent 25 minutes with the patient and more than 50% of the time was spent in consultation regarding the above.

## 2013-08-30 NOTE — Progress Notes (Signed)
Pre visit review using our clinic review tool, if applicable. No additional management support is needed unless otherwise documented below in the visit note. 

## 2013-08-31 ENCOUNTER — Encounter: Payer: Self-pay | Admitting: *Deleted

## 2013-09-01 ENCOUNTER — Encounter: Payer: Self-pay | Admitting: Internal Medicine

## 2013-09-02 ENCOUNTER — Encounter: Payer: Self-pay | Admitting: Internal Medicine

## 2013-09-02 DIAGNOSIS — Z86010 Personal history of colonic polyps: Secondary | ICD-10-CM

## 2013-09-05 ENCOUNTER — Encounter: Payer: Self-pay | Admitting: Internal Medicine

## 2013-09-05 NOTE — Assessment & Plan Note (Signed)
Exam as outlined.  Thyroid ultrasound revealed multiple nodules.  Referred to Dr Eddie Dibbles for endocrinology.

## 2013-09-05 NOTE — Assessment & Plan Note (Signed)
Most recent Cr improved.  Follow.  Stay hydrated.  Urinalysis negative.

## 2013-09-05 NOTE — Assessment & Plan Note (Signed)
Blood pressure doing better on Lisinopril.  Follow.

## 2013-09-05 NOTE — Assessment & Plan Note (Addendum)
Low cholesterol diet and exercise.  Follow lipid panel.  Discussed cholesterol labs and persistent elevation.  She declined pravastatin.  Follow.

## 2013-09-05 NOTE — Assessment & Plan Note (Signed)
Colonoscopy as outlined.  Follow.  

## 2014-01-24 ENCOUNTER — Other Ambulatory Visit: Payer: Self-pay | Admitting: Internal Medicine

## 2014-02-23 ENCOUNTER — Other Ambulatory Visit: Payer: Self-pay | Admitting: Internal Medicine

## 2014-03-06 ENCOUNTER — Ambulatory Visit: Payer: BC Managed Care – PPO | Admitting: Nurse Practitioner

## 2014-03-14 ENCOUNTER — Ambulatory Visit: Payer: BC Managed Care – PPO | Admitting: Nurse Practitioner

## 2014-09-06 ENCOUNTER — Ambulatory Visit (INDEPENDENT_AMBULATORY_CARE_PROVIDER_SITE_OTHER): Payer: BLUE CROSS/BLUE SHIELD | Admitting: Nurse Practitioner

## 2014-09-06 ENCOUNTER — Encounter: Payer: Self-pay | Admitting: Nurse Practitioner

## 2014-09-06 VITALS — BP 126/78 | HR 84 | Temp 98.2°F | Resp 16 | Ht 68.0 in | Wt 159.1 lb

## 2014-09-06 DIAGNOSIS — I10 Essential (primary) hypertension: Secondary | ICD-10-CM | POA: Diagnosis not present

## 2014-09-06 DIAGNOSIS — E7800 Pure hypercholesterolemia, unspecified: Secondary | ICD-10-CM

## 2014-09-06 DIAGNOSIS — E0789 Other specified disorders of thyroid: Secondary | ICD-10-CM | POA: Diagnosis not present

## 2014-09-06 DIAGNOSIS — Z0000 Encounter for general adult medical examination without abnormal findings: Secondary | ICD-10-CM | POA: Diagnosis not present

## 2014-09-06 DIAGNOSIS — N289 Disorder of kidney and ureter, unspecified: Secondary | ICD-10-CM

## 2014-09-06 LAB — CBC WITH DIFFERENTIAL/PLATELET
Basophils Absolute: 0 10*3/uL (ref 0.0–0.1)
Basophils Relative: 0.7 % (ref 0.0–3.0)
Eosinophils Absolute: 0.1 10*3/uL (ref 0.0–0.7)
Eosinophils Relative: 2.6 % (ref 0.0–5.0)
HCT: 38.9 % (ref 36.0–46.0)
Hemoglobin: 13.3 g/dL (ref 12.0–15.0)
Lymphocytes Relative: 41 % (ref 12.0–46.0)
Lymphs Abs: 1.6 10*3/uL (ref 0.7–4.0)
MCHC: 34 g/dL (ref 30.0–36.0)
MCV: 78.3 fl (ref 78.0–100.0)
Monocytes Absolute: 0.3 10*3/uL (ref 0.1–1.0)
Monocytes Relative: 9 % (ref 3.0–12.0)
Neutro Abs: 1.8 10*3/uL (ref 1.4–7.7)
Neutrophils Relative %: 46.7 % (ref 43.0–77.0)
Platelets: 316 10*3/uL (ref 150.0–400.0)
RBC: 4.97 Mil/uL (ref 3.87–5.11)
RDW: 13.6 % (ref 11.5–15.5)
WBC: 3.8 10*3/uL — ABNORMAL LOW (ref 4.0–10.5)

## 2014-09-06 LAB — BASIC METABOLIC PANEL
BUN: 9 mg/dL (ref 6–23)
CO2: 27 mEq/L (ref 19–32)
Calcium: 9.8 mg/dL (ref 8.4–10.5)
Chloride: 105 mEq/L (ref 96–112)
Creatinine, Ser: 0.86 mg/dL (ref 0.40–1.20)
GFR: 87.36 mL/min (ref >60.00)
Glucose, Bld: 98 mg/dL (ref 70–99)
Potassium: 3.8 mEq/L (ref 3.5–5.1)
Sodium: 139 mEq/L (ref 135–145)

## 2014-09-06 LAB — LIPID PANEL
Cholesterol: 242 mg/dL — ABNORMAL HIGH (ref 0–200)
HDL: 68.2 mg/dL (ref >39.00)
LDL Cholesterol: 134 mg/dL — ABNORMAL HIGH (ref 0–99)
NonHDL: 173.8
Total CHOL/HDL Ratio: 4
Triglycerides: 197 mg/dL — ABNORMAL HIGH (ref 0.0–149.0)
VLDL: 39.4 mg/dL (ref 0.0–40.0)

## 2014-09-06 LAB — TSH: TSH: 1.24 u[IU]/mL (ref 0.35–4.50)

## 2014-09-06 NOTE — Progress Notes (Signed)
Subjective:    Patient ID: Cheryl Powers, female    DOB: [birth year], 57 y.o.   MRN: [id number]  HPI  Cheryl Powers is a 59 yo female with a CC of needing insurance health wellness visit.   1) Health Maintenance-   Diet- 50/50 Eating at home and going out   Exercise- Walk 45 min daily   Immunizations- UTD, unsure of date of tdap  Mammogram- UTD  Pap- 2 weeks   Colonoscopy- 5 years ago   Eye Exam- Not UTD, scheduled for next month  Dental Exam- UTD  2) Chronic Problems-  HTN- Stable on no medication   Thyroid- Dr. Eddie Powers   3) Acute Problems-  No complaints   Review of Systems  Constitutional: Negative for fever, chills, diaphoresis and fatigue.  HENT: Negative for tinnitus and trouble swallowing.   Eyes: Negative for visual disturbance.  Respiratory: Negative for chest tightness, shortness of breath and wheezing.   Cardiovascular: Negative for chest pain, palpitations and leg swelling.  Gastrointestinal: Negative for nausea, vomiting and diarrhea.  Genitourinary: Negative for difficulty urinating.  Skin: Negative for rash.       Callus on left great toe  Neurological: Negative for dizziness, weakness, numbness and headaches.  Hematological: Does not bruise/bleed easily.  Psychiatric/Behavioral: Negative for suicidal ideas and sleep disturbance. The patient is not nervous/anxious.    No past medical history on file.  History   Social History  . Marital Status: Married    Spouse Name: N/A  . Number of Children: N/A  . Years of Education: N/A   Occupational History  . Not on file.   Social History Main Topics  . Smoking status: Never Smoker   . Smokeless tobacco: Never Used  . Alcohol Use: 0.0 oz/week    0 Standard drinks or equivalent per week     Comment: glass of wine rarely  . Drug Use: No  . Sexual Activity:    Partners: Male     Comment: Husband    Other Topics Concern  . Not on file   Social History Narrative   Lives with husband    Daughter is  grown and lives in [place]: None    Caffeine- No tea/coffee, dark chocolate only and occasionally    Right handed    Enjoys- HGTV    Past Surgical History  Procedure Laterality Date  . No past surgeries      Family History  Problem Relation Age of Onset  . Hypertension Father   . Diabetes Father   . Stroke Father 81    No Known Allergies  Current Outpatient Prescriptions on File Prior to Visit  Medication Sig Dispense Refill  . Biotin 1000 MCG tablet Take 1,000 mcg by mouth every other day.     . Cholecalciferol (VITAMIN D3) 1000 UNITS CAPS Take by mouth daily.    . fish oil-omega-3 fatty acids 1000 MG capsule Take 2 g by mouth every other day.    . fluticasone (FLONASE) 50 MCG/ACT nasal spray Place 2 sprays into both nostrils daily. (Patient not taking: Reported on 09/06/2014) 16 g 1  . lisinopril (PRINIVIL,ZESTRIL) 10 MG tablet TAKE ONE TABLET BY MOUTH ONCE DAILY (Patient not taking: Reported on 09/06/2014) 30 tablet 1   No current facility-administered medications on file prior to visit.      Objective:   Physical Exam  Constitutional: She is oriented to person, place, and time. She appears well-developed and well-nourished. No distress.  BP 126/78 mmHg  Pulse 84  Temp(Src) 98.2 F (36.8 C) (Oral)  Resp 16  Ht 5\' 8"  (1.727 m)  Wt 159 lb 1.9 oz (72.176 kg)  BMI 24.20 kg/m2  SpO2 96%  LMP 07/07/2008   HENT:  Head: Normocephalic and atraumatic.  Right Ear: External ear normal.  Left Ear: External ear normal.  Nose: Nose normal.  Mouth/Throat: Oropharynx is clear and moist. No oropharyngeal exudate.  TMs and canals clear bilaterally  Eyes: Conjunctivae and EOM are normal. Pupils are equal, round, and reactive to light. Right eye exhibits no discharge. Left eye exhibits no discharge. No scleral icterus.  Neck: Normal range of motion. Neck supple. Thyromegaly present.  Multiple nodules already found, seeing endocrine  Cardiovascular: Normal rate, regular  rhythm, normal heart sounds and intact distal pulses.  Exam reveals no gallop and no friction rub.   No murmur heard. Pulmonary/Chest: Effort normal and breath sounds normal. No respiratory distress. She has no wheezes. She has no rales. She exhibits no tenderness.  Abdominal: Soft. Bowel sounds are normal. She exhibits no distension and no mass. There is no tenderness. There is no rebound and no guarding.  Musculoskeletal: Normal range of motion. She exhibits no edema or tenderness.  Lymphadenopathy:    She has no cervical adenopathy.  Neurological: She is alert and oriented to person, place, and time. She has normal reflexes. No cranial nerve deficit. She exhibits normal muscle tone. Coordination normal.  Skin: Skin is warm and dry. No rash noted. She is not diaphoretic. No erythema. No pallor.  Psychiatric: She has a normal mood and affect. Her behavior is normal. Judgment and thought content normal.      Assessment & Plan:

## 2014-09-06 NOTE — Patient Instructions (Signed)

## 2014-09-06 NOTE — Progress Notes (Signed)
Pre visit review using our clinic review tool, if applicable. No additional management support is needed unless otherwise documented below in the visit note. 

## 2014-09-23 DIAGNOSIS — Z0000 Encounter for general adult medical examination without abnormal findings: Secondary | ICD-10-CM | POA: Insufficient documentation

## 2014-09-23 NOTE — Assessment & Plan Note (Signed)
Stable. Will obtain updated TSH. No complaints. Will follow.

## 2014-09-23 NOTE — Assessment & Plan Note (Signed)
Stable. Check updated lipid profile. Continue fish oil OTC regimen.

## 2014-09-23 NOTE — Assessment & Plan Note (Signed)
Stable. Obtain updated CMET. Encouraged water intake. Will follow.

## 2014-09-23 NOTE — Assessment & Plan Note (Signed)
Discussed acute and chronic issues. Reviewed health maintenance measures, PFSHx, and immunizations. Obtain routine labs TSH, Lipid panel, CBC w/ diff, A1c, and CMET.

## 2016-02-06 ENCOUNTER — Encounter: Payer: Self-pay | Admitting: Internal Medicine

## 2016-02-06 ENCOUNTER — Ambulatory Visit (INDEPENDENT_AMBULATORY_CARE_PROVIDER_SITE_OTHER): Payer: BLUE CROSS/BLUE SHIELD | Admitting: Internal Medicine

## 2016-02-06 VITALS — BP 150/98 | HR 85 | Temp 98.9°F | Ht 67.0 in | Wt 156.0 lb

## 2016-02-06 DIAGNOSIS — Z0000 Encounter for general adult medical examination without abnormal findings: Secondary | ICD-10-CM

## 2016-02-06 DIAGNOSIS — Z860100 Personal history of colon polyps, unspecified: Secondary | ICD-10-CM

## 2016-02-06 DIAGNOSIS — Z1231 Encounter for screening mammogram for malignant neoplasm of breast: Secondary | ICD-10-CM

## 2016-02-06 DIAGNOSIS — I10 Essential (primary) hypertension: Secondary | ICD-10-CM | POA: Diagnosis not present

## 2016-02-06 DIAGNOSIS — F439 Reaction to severe stress, unspecified: Secondary | ICD-10-CM

## 2016-02-06 DIAGNOSIS — E7800 Pure hypercholesterolemia, unspecified: Secondary | ICD-10-CM | POA: Diagnosis not present

## 2016-02-06 DIAGNOSIS — E0789 Other specified disorders of thyroid: Secondary | ICD-10-CM

## 2016-02-06 LAB — HEPATIC FUNCTION PANEL
ALT: 19 U/L (ref 0–35)
AST: 20 U/L (ref 0–37)
Albumin: 4.6 g/dL (ref 3.5–5.2)
Alkaline Phosphatase: 82 U/L (ref 39–117)
Bilirubin, Direct: 0 mg/dL (ref 0.0–0.3)
Total Bilirubin: 0.6 mg/dL (ref 0.2–1.2)
Total Protein: 8.1 g/dL (ref 6.0–8.3)

## 2016-02-06 LAB — BASIC METABOLIC PANEL
BUN: 13 mg/dL (ref 6–23)
CO2: 30 mEq/L (ref 19–32)
Calcium: 9.8 mg/dL (ref 8.4–10.5)
Chloride: 105 mEq/L (ref 96–112)
Creatinine, Ser: 0.93 mg/dL (ref 0.40–1.20)
GFR: 79.42 mL/min (ref >60.00)
Glucose, Bld: 99 mg/dL (ref 70–99)
Potassium: 3.6 mEq/L (ref 3.5–5.1)
Sodium: 141 mEq/L (ref 135–145)

## 2016-02-06 LAB — CBC WITH DIFFERENTIAL/PLATELET
Basophils Absolute: 0 10*3/uL (ref 0.0–0.1)
Basophils Relative: 0.6 % (ref 0.0–3.0)
Eosinophils Absolute: 0.1 10*3/uL (ref 0.0–0.7)
Eosinophils Relative: 1.7 % (ref 0.0–5.0)
HCT: 40.5 % (ref 36.0–46.0)
Hemoglobin: 13.4 g/dL (ref 12.0–15.0)
Lymphocytes Relative: 34.9 % (ref 12.0–46.0)
Lymphs Abs: 1.6 10*3/uL (ref 0.7–4.0)
MCHC: 33.2 g/dL (ref 30.0–36.0)
MCV: 79.5 fl (ref 78.0–100.0)
Monocytes Absolute: 0.3 10*3/uL (ref 0.1–1.0)
Monocytes Relative: 7.1 % (ref 3.0–12.0)
Neutro Abs: 2.5 10*3/uL (ref 1.4–7.7)
Neutrophils Relative %: 55.7 % (ref 43.0–77.0)
Platelets: 326 10*3/uL (ref 150.0–400.0)
RBC: 5.09 Mil/uL (ref 3.87–5.11)
RDW: 13.5 % (ref 11.5–15.5)
WBC: 4.5 10*3/uL (ref 4.0–10.5)

## 2016-02-06 LAB — LIPID PANEL
Cholesterol: 245 mg/dL — ABNORMAL HIGH (ref 0–200)
HDL: 65 mg/dL (ref >39.00)
LDL Cholesterol: 153 mg/dL — ABNORMAL HIGH (ref 0–99)
NonHDL: 180.25
Total CHOL/HDL Ratio: 4
Triglycerides: 136 mg/dL (ref 0.0–149.0)
VLDL: 27.2 mg/dL (ref 0.0–40.0)

## 2016-02-06 LAB — TSH: TSH: 1.21 u[IU]/mL (ref 0.35–4.50)

## 2016-02-06 MED ORDER — HYDROCHLOROTHIAZIDE 12.5 MG PO CAPS: 12.5000 mg | ORAL_CAPSULE | Freq: Every day | ORAL | 1 refills | Status: DC

## 2016-02-06 NOTE — Progress Notes (Signed)
Patient ID: Cheryl Powers, female   DOB: 11-Apr-1956, 60 y.o.   MRN: [id number]   Subjective:    Patient ID: Cheryl Powers, female    DOB: 11-Nov-1955, 60 y.o.   MRN: [id number]  HPI  Patient here for her physical exam.  She reports she is doing well.  She is under increased stress with her job.  Discussed with her today.  She tries to stay active.  No chest pain or tightness.  No sob.  No acid reflux.  No abdominal pain or cramping.  Bowels stable.     History reviewed. No pertinent past medical history. Past Surgical History:  Procedure Laterality Date  . NO PAST SURGERIES     Family History  Problem Relation Age of Onset  . Hypertension Father   . Diabetes Father   . Stroke Father 28   Social History   Social History  . Marital status: Married    Spouse name: N/A  . Number of children: N/A  . Years of education: N/A   Social History Main Topics  . Smoking status: Never Smoker  . Smokeless tobacco: Never Used  . Alcohol use 0.0 oz/week     Comment: glass of wine rarely  . Drug use: No  . Sexual activity: Yes    Partners: Male     Comment: Husband    Other Topics Concern  . None   Social History Narrative   Lives with husband    Daughter is grown and lives in [place]: None    Caffeine- No tea/coffee, dark chocolate only and occasionally    Right handed    Enjoys- HGTV    Outpatient Encounter Prescriptions as of 02/06/2016  Medication Sig  . hydrochlorothiazide (MICROZIDE) 12.5 MG capsule Take 1 capsule (12.5 mg total) by mouth daily.  . [DISCONTINUED] Biotin 1000 MCG tablet Take 1,000 mcg by mouth every other day.   . [DISCONTINUED] Cholecalciferol (VITAMIN D3) 1000 UNITS CAPS Take by mouth daily.  . [DISCONTINUED] fish oil-omega-3 fatty acids 1000 MG capsule Take 2 g by mouth every other day.  . [DISCONTINUED] fluticasone (FLONASE) 50 MCG/ACT nasal spray Place 2 sprays into both nostrils daily. (Patient not taking: Reported on 09/06/2014)  .  [DISCONTINUED] lisinopril (PRINIVIL,ZESTRIL) 10 MG tablet TAKE ONE TABLET BY MOUTH ONCE DAILY (Patient not taking: Reported on 09/06/2014)   No facility-administered encounter medications on file as of 02/06/2016.     Review of Systems  Constitutional: Negative for appetite change and unexpected weight change.  HENT: Negative for congestion and sinus pressure.   Eyes: Negative for pain and visual disturbance.  Respiratory: Negative for cough, chest tightness and shortness of breath.   Cardiovascular: Negative for chest pain, palpitations and leg swelling.  Gastrointestinal: Negative for abdominal pain, diarrhea, nausea and vomiting.  Genitourinary: Negative for difficulty urinating and dysuria.  Musculoskeletal: Negative for back pain and joint swelling.  Skin: Negative for color change and rash.  Neurological: Negative for dizziness, light-headedness and headaches.  Hematological: Negative for adenopathy. Does not bruise/bleed easily.  Psychiatric/Behavioral: Negative for agitation and dysphoric mood.       Increased stress as outlined.         Objective:     Blood pressure rechecked by me:  152/88  Physical Exam  Constitutional: She is oriented to person, place, and time. She appears well-developed and well-nourished. No distress.  HENT:  Nose: Nose normal.  Mouth/Throat: Oropharynx is clear and moist.  Eyes: Right eye  exhibits no discharge. Left eye exhibits no discharge. No scleral icterus.  Neck: Neck supple.  Thyroid fullness on exam.    Cardiovascular: Normal rate and regular rhythm.   Pulmonary/Chest: Breath sounds normal. No accessory muscle usage. No tachypnea. No respiratory distress. She has no decreased breath sounds. She has no wheezes. She has no rhonchi. Right breast exhibits no inverted nipple, no mass, no nipple discharge and no tenderness (no axillary adenopathy). Left breast exhibits no inverted nipple, no mass, no nipple discharge and no tenderness (no axilarry  adenopathy).  Abdominal: Soft. Bowel sounds are normal. There is no tenderness.  Musculoskeletal: She exhibits no edema or tenderness.  Lymphadenopathy:    She has no cervical adenopathy.  Neurological: She is alert and oriented to person, place, and time.  Skin: Skin is warm. No rash noted. No erythema.  Psychiatric: She has a normal mood and affect. Her behavior is normal.    BP (!) 150/98   Pulse 85   Temp 98.9 F (37.2 C) (Oral)   Ht 5\' 7"  (1.702 m)   Wt 156 lb (70.8 kg)   LMP 07/07/2008   SpO2 98%   BMI 24.43 kg/m  Wt Readings from Last 3 Encounters:  02/06/16 156 lb (70.8 kg)  09/06/14 159 lb 1.9 oz (72.2 kg)  08/30/13 147 lb (66.7 kg)     Lab Results  Component Value Date   WBC 4.5 02/06/2016   HGB 13.4 02/06/2016   HCT 40.5 02/06/2016   PLT 326.0 02/06/2016   GLUCOSE 99 02/06/2016   CHOL 245 (H) 02/06/2016   TRIG 136.0 02/06/2016   HDL 65.00 02/06/2016   LDLDIRECT 138.3 07/07/2012   LDLCALC 153 (H) 02/06/2016   ALT 19 02/06/2016   AST 20 02/06/2016   NA 141 02/06/2016   K 3.6 02/06/2016   CL 105 02/06/2016   CREATININE 0.93 02/06/2016   BUN 13 02/06/2016   CO2 30 02/06/2016   TSH 1.21 02/06/2016   HGBA1C 5.9 07/13/2013        Assessment & Plan:   Problem List Items Addressed This Visit    Essential hypertension, benign    Blood pressure elevated.  Not sure that she tolerated lisinopril.  Will start hctz 12.5mg  q day.  Have her spot check her pressure.  Get her back in soon to reassess.        Relevant Medications   hydrochlorothiazide (MICROZIDE) 12.5 MG capsule   Other Relevant Orders   CBC with Differential/Platelet (Completed)   Basic metabolic panel (Completed)   Healthcare maintenance    Physical today 02/06/16.  Gets her pap smears through [facility].  Mammogram at [facility]   Obtain results.  Schedule f/u mammogram.  Treat blood pressure and then address colonoscopy.       History of colonic polyps    Colonoscopy 09/20/10 - rectal polyp  (hyperplastic).        Hypercholesterolemia    Low cholesterol diet and exercise.  Follow lipid panel.        Relevant Medications   hydrochlorothiazide (MICROZIDE) 12.5 MG capsule   Other Relevant Orders   Hepatic function panel (Completed)   Lipid panel (Completed)   Stress    Increased stress as outlined.  Discussed with her today.  She does not feel needs any further intervention.  Follow.        Thyroid fullness    Reviewed previous ultrasound.  May need f/u ultrasound. Obtain tsh.  Follow.       Relevant Orders  TSH (Completed)    Other Visit Diagnoses    Routine general medical examination at a health care facility    -  Primary   Encounter for screening mammogram for breast cancer       Relevant Orders   MM Digital Screening       Einar Pheasant, MD

## 2016-02-06 NOTE — Assessment & Plan Note (Signed)
Low cholesterol diet and exercise.  Follow lipid panel.   

## 2016-02-06 NOTE — Assessment & Plan Note (Signed)
Physical today 02/06/16.  Gets her pap smears through [facility].  Mammogram at [facility].   Obtain results.  Schedule f/u mammogram.  Treat blood pressure and then address colonoscopy.

## 2016-02-06 NOTE — Assessment & Plan Note (Addendum)
Reviewed previous ultrasound.  May need f/u ultrasound. Obtain tsh.  Follow.

## 2016-02-06 NOTE — Progress Notes (Signed)
Pre visit review using our clinic review tool, if applicable. No additional management support is needed unless otherwise documented below in the visit note. 

## 2016-02-06 NOTE — Assessment & Plan Note (Signed)
Blood pressure elevated.  Not sure that she tolerated lisinopril.  Will start hctz 12.5mg  q day.  Have her spot check her pressure.  Get her back in soon to reassess.

## 2016-02-07 ENCOUNTER — Telehealth: Payer: Self-pay | Admitting: Internal Medicine

## 2016-02-07 NOTE — Telephone Encounter (Signed)
Pt called back returning your call.  Call pt @ [phone]

## 2016-02-09 ENCOUNTER — Encounter: Payer: Self-pay | Admitting: Internal Medicine

## 2016-02-09 DIAGNOSIS — F439 Reaction to severe stress, unspecified: Secondary | ICD-10-CM | POA: Insufficient documentation

## 2016-02-09 NOTE — Assessment & Plan Note (Signed)
Increased stress as outlined.  Discussed with her today.  She does not feel needs any further intervention.  Follow.   

## 2016-02-09 NOTE — Assessment & Plan Note (Signed)
Colonoscopy 09/20/10 - rectal polyp (hyperplastic).

## 2016-02-14 ENCOUNTER — Ambulatory Visit
Admission: RE | Admit: 2016-02-14 | Discharge: 2016-02-14 | Disposition: A | Discharge status: Home / Self Care | Payer: BLUE CROSS/BLUE SHIELD | Source: Ambulatory Visit | Attending: Internal Medicine | Admitting: Internal Medicine

## 2016-02-14 DIAGNOSIS — Z1231 Encounter for screening mammogram for malignant neoplasm of breast: Secondary | ICD-10-CM

## 2016-02-18 ENCOUNTER — Ambulatory Visit
Admission: RE | Admit: 2016-02-18 | Discharge: 2016-02-18 | Disposition: A | Discharge status: Home / Self Care | Payer: Self-pay | Source: Ambulatory Visit | Attending: *Deleted | Admitting: *Deleted

## 2016-02-18 ENCOUNTER — Other Ambulatory Visit: Payer: Self-pay | Admitting: *Deleted

## 2016-02-18 DIAGNOSIS — Z9289 Personal history of other medical treatment: Secondary | ICD-10-CM

## 2016-03-07 ENCOUNTER — Ambulatory Visit: Payer: BLUE CROSS/BLUE SHIELD

## 2016-03-26 ENCOUNTER — Telehealth: Payer: Self-pay | Admitting: Internal Medicine

## 2016-03-26 NOTE — Telephone Encounter (Signed)
Pt states that she stopped taking the MICROZIDE 12.5 MG capsule because it made her feel foggy.. Pt stated that she has also changed her diet and exercise habits.. Please advise pt with any questions

## 2016-03-26 NOTE — Telephone Encounter (Signed)
Patient states its been down and she has been changing her diet and has been exercising.

## 2016-03-26 NOTE — Telephone Encounter (Signed)
fyi

## 2016-03-26 NOTE — Telephone Encounter (Signed)
Need to know how her blood pressures are running.  If she is not checking, have her spot check her pressure and send in readings over the next few weeks.

## 2016-03-28 ENCOUNTER — Ambulatory Visit: Payer: BLUE CROSS/BLUE SHIELD | Admitting: Internal Medicine

## 2016-05-02 ENCOUNTER — Telehealth: Payer: Self-pay | Admitting: Internal Medicine

## 2016-05-02 NOTE — Telephone Encounter (Signed)
Pt is scheduled °

## 2016-05-02 NOTE — Telephone Encounter (Signed)
I do not mind taking him as a pt, but my established care appts are scheduled out.  He will need to see someone now for his sugar and blood pressure.  Should have had f/u arranged at the hospital and sounds like needs to see endocrinology as well.

## 2016-05-02 NOTE — Telephone Encounter (Signed)
Will you take the husband as a new patient?

## 2016-05-02 NOTE — Telephone Encounter (Signed)
Patient is fine with this. He will see his current PCP for now until you can get him in. I will have Caryl Pina schedule her husband for a New Patient appointment.

## 2016-05-02 NOTE — Telephone Encounter (Signed)
Pt called and was wondering if Dr. Nicki Reaper would take on her husband as a new patient. He was just hospitalized for elevated blood pressure, and just found out that he was diabetic blood sugar was 700 when they did a urine. Please advise, thank you!  Call pt @ [phone]

## 2016-05-20 ENCOUNTER — Encounter: Payer: Self-pay | Admitting: Internal Medicine

## 2016-05-20 ENCOUNTER — Ambulatory Visit (INDEPENDENT_AMBULATORY_CARE_PROVIDER_SITE_OTHER): Payer: BLUE CROSS/BLUE SHIELD | Admitting: Internal Medicine

## 2016-05-20 ENCOUNTER — Encounter (INDEPENDENT_AMBULATORY_CARE_PROVIDER_SITE_OTHER): Payer: Self-pay

## 2016-05-20 DIAGNOSIS — E0789 Other specified disorders of thyroid: Secondary | ICD-10-CM | POA: Diagnosis not present

## 2016-05-20 DIAGNOSIS — E7800 Pure hypercholesterolemia, unspecified: Secondary | ICD-10-CM

## 2016-05-20 DIAGNOSIS — F439 Reaction to severe stress, unspecified: Secondary | ICD-10-CM | POA: Diagnosis not present

## 2016-05-20 DIAGNOSIS — I10 Essential (primary) hypertension: Secondary | ICD-10-CM

## 2016-05-20 NOTE — Progress Notes (Signed)
Pre-visit discussion using our clinic review tool. No additional management support is needed unless otherwise documented below in the visit note.  

## 2016-05-20 NOTE — Progress Notes (Signed)
Patient ID: Cheryl Powers, female   DOB: 1956-03-29, 61 y.o.   MRN: [id number]   Subjective:    Patient ID: Cheryl Powers, female    DOB: Nov 30, 1955, 61 y.o.   MRN: [id number]  HPI  Patient here for a scheduled follow up.  She is feeling better.  Restarted her blood pressure medication.  Tolerating now.  She has adjusted her diet.  Is exercising now.  Husband recently diagnosed with diabetes.  They have both changed their diet.  No chest pain.  No sob.  No acid reflux.  No abdominal pain or cramping.  Bowels stable.  Still with increased stress.  Handling stress better.    History reviewed. No pertinent past medical history. Past Surgical History:  Procedure Laterality Date  . NO PAST SURGERIES     Family History  Problem Relation Age of Onset  . Hypertension Father   . Diabetes Father   . Stroke Father 65   Social History   Social History  . Marital status: Married    Spouse name: N/A  . Number of children: N/A  . Years of education: N/A   Social History Main Topics  . Smoking status: Never Smoker  . Smokeless tobacco: Never Used  . Alcohol use 0.0 oz/week     Comment: glass of wine rarely  . Drug use: No  . Sexual activity: Yes    Partners: Male     Comment: Husband    Other Topics Concern  . None   Social History Narrative   Lives with husband    Daughter is grown and lives in [place]: None    Caffeine- No tea/coffee, dark chocolate only and occasionally    Right handed    Enjoys- HGTV    Outpatient Encounter Prescriptions as of 05/20/2016  Medication Sig  . hydrochlorothiazide (MICROZIDE) 12.5 MG capsule Take 1 capsule (12.5 mg total) by mouth daily.   No facility-administered encounter medications on file as of 05/20/2016.     Review of Systems  Constitutional: Negative for appetite change and unexpected weight change.  HENT: Negative for congestion and sinus pain.   Respiratory: Negative for cough, chest tightness and shortness of breath.     Cardiovascular: Negative for chest pain, palpitations and leg swelling.  Gastrointestinal: Negative for abdominal pain, diarrhea, nausea and vomiting.  Genitourinary: Negative for difficulty urinating and dysuria.  Musculoskeletal: Negative for back pain and joint swelling.  Skin: Negative for color change and rash.  Neurological: Negative for dizziness, light-headedness and headaches.  Psychiatric/Behavioral: Negative for agitation and dysphoric mood.       Objective:    Physical Exam  Constitutional: She appears well-developed and well-nourished. No distress.  HENT:  Nose: Nose normal.  Mouth/Throat: Oropharynx is clear and moist.  Neck: Neck supple. No thyromegaly present.  Cardiovascular: Normal rate and regular rhythm.   Pulmonary/Chest: Breath sounds normal. No respiratory distress. She has no wheezes.  Abdominal: Soft. Bowel sounds are normal. There is no tenderness.  Musculoskeletal: She exhibits no edema or tenderness.  Lymphadenopathy:    She has no cervical adenopathy.  Skin: No rash noted. No erythema.  Psychiatric: Her behavior is normal.    BP 140/78 (BP Location: Left Arm, Patient Position: Sitting, Cuff Size: Large)   Pulse 75   Temp 98.6 F (37 C) (Oral)   Resp 16   Ht 5\' 7"  (1.702 m)   Wt 155 lb 3.2 oz (70.4 kg)   LMP 07/07/2008  SpO2 99%   BMI 24.31 kg/m  Wt Readings from Last 3 Encounters:  05/20/16 155 lb 3.2 oz (70.4 kg)  02/06/16 156 lb (70.8 kg)  09/06/14 159 lb 1.9 oz (72.2 kg)     Lab Results  Component Value Date   WBC 4.5 02/06/2016   HGB 13.4 02/06/2016   HCT 40.5 02/06/2016   PLT 326.0 02/06/2016   GLUCOSE 99 02/06/2016   CHOL 245 (H) 02/06/2016   TRIG 136.0 02/06/2016   HDL 65.00 02/06/2016   LDLDIRECT 138.3 07/07/2012   LDLCALC 153 (H) 02/06/2016   ALT 19 02/06/2016   AST 20 02/06/2016   NA 141 02/06/2016   K 3.6 02/06/2016   CL 105 02/06/2016   CREATININE 0.93 02/06/2016   BUN 13 02/06/2016   CO2 30 02/06/2016   TSH  1.21 02/06/2016   HGBA1C 5.9 07/13/2013    Mm Outside Films Mammo  Result Date: 02/18/2016 CLINICAL DATA:  This exam is stored here for comparison purposes only and was performed at an outside facility.   Please contact the originating institution for any associated interpretation or report.       Assessment & Plan:   Problem List Items Addressed This Visit    Essential hypertension, benign    Blood pressure on recheck today improved.  Just restarted hctz.  Tolerating.  Follow pressures.  Follow metabolic panel.        Relevant Orders   Basic metabolic panel   Hypercholesterolemia    Low cholesterol diet and exercise.  Follow lipid panel.        Relevant Orders   Lipid panel   Hepatic function panel   Stress    Increased stress.  Doing better.  Feels better.  Follow.        Thyroid fullness    Previous ultrasound revealed thyroid nodule.  Needs f/u ultrasound.  She will notify me when agreeable.            Einar Pheasant, MD

## 2016-05-25 ENCOUNTER — Encounter: Payer: Self-pay | Admitting: Internal Medicine

## 2016-05-25 NOTE — Assessment & Plan Note (Signed)
Low cholesterol diet and exercise.  Follow lipid panel.   

## 2016-05-25 NOTE — Assessment & Plan Note (Signed)
Blood pressure on recheck today improved.  Just restarted hctz.  Tolerating.  Follow pressures.  Follow metabolic panel.

## 2016-05-25 NOTE — Assessment & Plan Note (Signed)
Previous ultrasound revealed thyroid nodule.  Needs f/u ultrasound.  She will notify me when agreeable.

## 2016-05-25 NOTE — Assessment & Plan Note (Signed)
Increased stress.  Doing better.  Feels better.  Follow.

## 2016-06-22 ENCOUNTER — Other Ambulatory Visit: Payer: Self-pay | Admitting: Internal Medicine

## 2016-07-07 ENCOUNTER — Telehealth: Payer: Self-pay | Admitting: Internal Medicine

## 2016-07-07 NOTE — Telephone Encounter (Signed)
Patient advised of below she will come in tomorrow am at 8:00 am. Patient aware of appointment.

## 2016-07-07 NOTE — Telephone Encounter (Signed)
If she is ok to wait until tomorrow, I can see her tomorrow at 8:00

## 2016-07-07 NOTE — Telephone Encounter (Signed)
The patient called concerned that she maybe having side effects from taking the Hydrochlorothiazide 12.5 mg. She stated that she has had a few episodes of her heart beating rapidly and waking her up at night . She also stated that she is having night sweats as well. Patient wants a call back from her physician.

## 2016-07-07 NOTE — Telephone Encounter (Signed)
Patient is having episodes where she awakes up at night with heart racing she hears it beating in her  ear and  up with faces sweating.  She then take  natural magnesium powder called  from the health food store called calm(natural magnesium supplement) also taking apple cider vinegar.  Takes 1 baby aspirin.   She is still taking HCTZ 12.5 mg started on 06/23/16   Patient denies headache or chest pain .  Please advise.  Next office visit 07/22/16

## 2016-07-08 ENCOUNTER — Ambulatory Visit (INDEPENDENT_AMBULATORY_CARE_PROVIDER_SITE_OTHER): Payer: BLUE CROSS/BLUE SHIELD | Admitting: Internal Medicine

## 2016-07-08 ENCOUNTER — Encounter: Payer: Self-pay | Admitting: Internal Medicine

## 2016-07-08 VITALS — BP 148/92 | HR 76 | Temp 97.7°F | Resp 12 | Ht 67.5 in | Wt 151.0 lb

## 2016-07-08 DIAGNOSIS — I10 Essential (primary) hypertension: Secondary | ICD-10-CM | POA: Diagnosis not present

## 2016-07-08 DIAGNOSIS — Z0000 Encounter for general adult medical examination without abnormal findings: Secondary | ICD-10-CM

## 2016-07-08 DIAGNOSIS — E0789 Other specified disorders of thyroid: Secondary | ICD-10-CM | POA: Diagnosis not present

## 2016-07-08 DIAGNOSIS — R0602 Shortness of breath: Secondary | ICD-10-CM | POA: Diagnosis not present

## 2016-07-08 DIAGNOSIS — R000 Tachycardia, unspecified: Secondary | ICD-10-CM

## 2016-07-08 DIAGNOSIS — E7800 Pure hypercholesterolemia, unspecified: Secondary | ICD-10-CM

## 2016-07-08 DIAGNOSIS — Z833 Family history of diabetes mellitus: Secondary | ICD-10-CM

## 2016-07-08 DIAGNOSIS — F439 Reaction to severe stress, unspecified: Secondary | ICD-10-CM | POA: Diagnosis not present

## 2016-07-08 DIAGNOSIS — R002 Palpitations: Secondary | ICD-10-CM | POA: Diagnosis not present

## 2016-07-08 DIAGNOSIS — E782 Mixed hyperlipidemia: Secondary | ICD-10-CM | POA: Diagnosis not present

## 2016-07-08 LAB — BASIC METABOLIC PANEL
BUN: 13 mg/dL (ref 6–23)
CO2: 28 mEq/L (ref 19–32)
Calcium: 9.8 mg/dL (ref 8.4–10.5)
Chloride: 105 mEq/L (ref 96–112)
Creatinine, Ser: 0.84 mg/dL (ref 0.40–1.20)
GFR: 89.19 mL/min (ref >60.00)
Glucose, Bld: 100 mg/dL — ABNORMAL HIGH (ref 70–99)
Potassium: 3.7 mEq/L (ref 3.5–5.1)
Sodium: 142 mEq/L (ref 135–145)

## 2016-07-08 LAB — LIPID PANEL
Cholesterol: 201 mg/dL — ABNORMAL HIGH (ref 0–200)
HDL: 61.7 mg/dL (ref >39.00)
LDL Cholesterol: 111 mg/dL — ABNORMAL HIGH (ref 0–99)
NonHDL: 139.38
Total CHOL/HDL Ratio: 3
Triglycerides: 140 mg/dL (ref 0.0–149.0)
VLDL: 28 mg/dL (ref 0.0–40.0)

## 2016-07-08 LAB — HEPATIC FUNCTION PANEL
ALT: 14 U/L (ref 0–35)
AST: 16 U/L (ref 0–37)
Albumin: 4.6 g/dL (ref 3.5–5.2)
Alkaline Phosphatase: 83 U/L (ref 39–117)
Bilirubin, Direct: 0.1 mg/dL (ref 0.0–0.3)
Total Bilirubin: 0.5 mg/dL (ref 0.2–1.2)
Total Protein: 7.4 g/dL (ref 6.0–8.3)

## 2016-07-08 LAB — TSH: TSH: 0.88 u[IU]/mL (ref 0.35–4.50)

## 2016-07-08 LAB — HEMOGLOBIN A1C: Hgb A1c MFr Bld: 5.9 % (ref 4.6–6.5)

## 2016-07-08 LAB — MAGNESIUM: Magnesium: 2.3 mg/dL (ref 1.5–2.5)

## 2016-07-08 MED ORDER — LISINOPRIL-HYDROCHLOROTHIAZIDE 10-12.5 MG PO TABS: 1.0000 | ORAL_TABLET | Freq: Every day | ORAL | 1 refills | Status: DC

## 2016-07-08 NOTE — Progress Notes (Signed)
Pre-visit discussion using our clinic review tool. No additional management support is needed unless otherwise documented below in the visit note.  

## 2016-07-08 NOTE — Progress Notes (Signed)
Patient ID: Cheryl Powers, female   DOB: 05-Dec-1955, 61 y.o.   MRN: [id number]   Subjective:    Patient ID: Cheryl Powers, female    DOB: [birth year], 61 y.o.   MRN: [id number]  HPI  Patient here as a work in with concerns regarding increased heart rate and some sweating.  States waking with increased heart rate.  Intermittent.  States she takes 1/2 tablespoon of magnesium/water and a baby aspirin.  After approximately 5-10 minutes it levels off.  No chest pain.  No sob.  Hears heart beat in her head at times.  Mostly happens in the evening.  Occasionally will happen at work.  Is walking 2-3 miles.  No problems with walking. Increased stress.  Discussed with her today.  She feels she is handling things relatively well.  Blood pressure is staying elevated.     History reviewed. No pertinent past medical history. Past Surgical History:  Procedure Laterality Date  . NO PAST SURGERIES     Family History  Problem Relation Age of Onset  . Hypertension Father   . Diabetes Father   . Stroke Father 54   Social History   Social History  . Marital status: Married    Spouse name: N/A  . Number of children: N/A  . Years of education: N/A   Social History Main Topics  . Smoking status: Never Smoker  . Smokeless tobacco: Never Used  . Alcohol use 0.0 oz/week     Comment: glass of wine rarely  . Drug use: No  . Sexual activity: Yes    Partners: Male     Comment: Husband    Other Topics Concern  . None   Social History Narrative   Lives with husband    Daughter is grown and lives in [place]: None    Caffeine- No tea/coffee, dark chocolate only and occasionally    Right handed    Enjoys- HGTV    Outpatient Encounter Prescriptions as of 07/08/2016  Medication Sig  . [DISCONTINUED] hydrochlorothiazide (MICROZIDE) 12.5 MG capsule TAKE ONE CAPSULE BY MOUTH ONCE DAILY  . lisinopril-hydrochlorothiazide (PRINZIDE,ZESTORETIC) 10-12.5 MG tablet Take 1 tablet by mouth daily.   No  facility-administered encounter medications on file as of 07/08/2016.     Review of Systems  Constitutional: Negative for appetite change and unexpected weight change.  HENT: Negative for congestion and sinus pressure.   Respiratory: Negative for cough, chest tightness and shortness of breath.   Cardiovascular: Negative for chest pain and leg swelling.       Increased heart rate as outlined.    Gastrointestinal: Negative for abdominal pain, diarrhea, nausea and vomiting.  Genitourinary: Negative for difficulty urinating and dysuria.  Musculoskeletal: Negative for back pain and joint swelling.  Skin: Negative for color change and rash.  Neurological: Negative for dizziness, light-headedness and headaches.  Psychiatric/Behavioral: Negative for agitation and dysphoric mood.       Objective:     Blood pressure rechecked by me:  138/88  Physical Exam  Constitutional: She appears well-developed and well-nourished. No distress.  HENT:  Nose: Nose normal.  Mouth/Throat: Oropharynx is clear and moist.  Neck: Neck supple. No thyromegaly present.  Cardiovascular: Normal rate and regular rhythm.   Pulmonary/Chest: Breath sounds normal. No respiratory distress. She has no wheezes.  Abdominal: Soft. Bowel sounds are normal. There is no tenderness.  Musculoskeletal: She exhibits no edema or tenderness.  Lymphadenopathy:    She has no cervical adenopathy.  Skin:  No rash noted. No erythema.  Psychiatric: She has a normal mood and affect. Her behavior is normal.    BP (!) 148/92 (BP Location: Left Arm, Patient Position: Sitting, Cuff Size: Normal)   Pulse 76   Temp 97.7 F (36.5 C) (Oral)   Resp 12   Ht 5' 7.5" (1.715 m)   Wt 151 lb (68.5 kg)   LMP 07/07/2008   SpO2 98%   BMI 23.30 kg/m  Wt Readings from Last 3 Encounters:  07/08/16 151 lb (68.5 kg)  05/20/16 155 lb 3.2 oz (70.4 kg)  02/06/16 156 lb (70.8 kg)     Lab Results  Component Value Date   WBC 4.5 02/06/2016   HGB  13.4 02/06/2016   HCT 40.5 02/06/2016   PLT 326.0 02/06/2016   GLUCOSE 100 (H) 07/08/2016   CHOL 201 (H) 07/08/2016   TRIG 140.0 07/08/2016   HDL 61.70 07/08/2016   LDLDIRECT 138.3 07/07/2012   LDLCALC 111 (H) 07/08/2016   ALT 14 07/08/2016   AST 16 07/08/2016   NA 142 07/08/2016   K 3.7 07/08/2016   CL 105 07/08/2016   CREATININE 0.84 07/08/2016   BUN 13 07/08/2016   CO2 28 07/08/2016   TSH 0.88 07/08/2016   HGBA1C 5.9 07/08/2016    Mm Outside Films Mammo  Result Date: 02/18/2016 CLINICAL DATA:  This exam is stored here for comparison purposes only and was performed at an outside facility.   Please contact the originating institution for any associated interpretation or report.       Assessment & Plan:   Problem List Items Addressed This Visit    Essential hypertension, benign    Blood pressure still elevated.  Will change her blood pressure medication to lisinopril/hctz 10/12.5 q day.  Follow pressures.  Get her back in soon to reassess.        Relevant Medications   lisinopril-hydrochlorothiazide (PRINZIDE,ZESTORETIC) 10-12.5 MG tablet   Other Relevant Orders   Basic metabolic panel (Completed)   Ambulatory referral to Cardiology   Healthcare maintenance    Physical 02/06/16 physical.        Hypercholesterolemia    Low cholesterol diet and exercise.  Follow lipid panel.        Relevant Medications   lisinopril-hydrochlorothiazide (PRINZIDE,ZESTORETIC) 10-12.5 MG tablet   Other Relevant Orders   Lipid panel (Completed)   Hepatic function panel (Completed)   Stress    Increased stress as outlined.  Discussed with her today.  She does not feel needs anything more at this point.  Follow.        Tachycardia    Increased heart rate as outlined.  EKG - SR with flattening Twaves and TWI laterally (III and aVF and v4-v6).  Given symptoms and changes on EKG, feel further cardiac w/up warranted.  Refer to cardiology.  Dr Marylee Floras to see her today.        Thyroid  fullness   Relevant Orders   TSH (Completed)    Other Visit Diagnoses    Increased heart rate    -  Primary   Relevant Orders   EKG 12-Lead (Completed)   Magnesium (Completed)   Ambulatory referral to Cardiology   Family history of diabetes mellitus       Relevant Orders   Hemoglobin A1c (Completed)       Einar Pheasant, MD

## 2016-07-13 ENCOUNTER — Encounter: Payer: Self-pay | Admitting: Internal Medicine

## 2016-07-13 DIAGNOSIS — R000 Tachycardia, unspecified: Secondary | ICD-10-CM | POA: Insufficient documentation

## 2016-07-13 NOTE — Assessment & Plan Note (Signed)
Low cholesterol diet and exercise.  Follow lipid panel.   

## 2016-07-13 NOTE — Assessment & Plan Note (Signed)
Increased stress as outlined.  Discussed with her today.  She does not feel needs anything more at this point.  Follow.

## 2016-07-13 NOTE — Assessment & Plan Note (Signed)
Increased heart rate as outlined.  EKG - SR with flattening Twaves and TWI laterally (III and aVF and v4-v6).  Given symptoms and changes on EKG, feel further cardiac w/up warranted.  Refer to cardiology.  Dr Marylee Floras to see her today.

## 2016-07-13 NOTE — Assessment & Plan Note (Signed)
Blood pressure still elevated.  Will change her blood pressure medication to lisinopril/hctz 10/12.5 q day.  Follow pressures.  Get her back in soon to reassess.

## 2016-07-13 NOTE — Assessment & Plan Note (Signed)
Physical 02/06/16 physical.

## 2016-07-16 DIAGNOSIS — R002 Palpitations: Secondary | ICD-10-CM | POA: Diagnosis not present

## 2016-07-22 ENCOUNTER — Ambulatory Visit (INDEPENDENT_AMBULATORY_CARE_PROVIDER_SITE_OTHER): Payer: BLUE CROSS/BLUE SHIELD | Admitting: Internal Medicine

## 2016-07-22 ENCOUNTER — Encounter: Payer: Self-pay | Admitting: Internal Medicine

## 2016-07-22 DIAGNOSIS — F439 Reaction to severe stress, unspecified: Secondary | ICD-10-CM

## 2016-07-22 DIAGNOSIS — E7800 Pure hypercholesterolemia, unspecified: Secondary | ICD-10-CM | POA: Diagnosis not present

## 2016-07-22 DIAGNOSIS — I10 Essential (primary) hypertension: Secondary | ICD-10-CM

## 2016-07-22 NOTE — Progress Notes (Signed)
Patient ID: Cheryl Powers, female   DOB: Jan 22, 1956, 61 y.o.   MRN: [id number]   Subjective:    Patient ID: Cheryl Powers, female    DOB: 1955-06-04, 61 y.o.   MRN: [id number]  HPI  Patient here for a scheduled follow up.  Was seen recently for increased palpitations.  Referred to cardiology.  Holter monitor placed.  Also scheduled for ECHO and stress test.  These tests are scheduled for 08/05/16.  She has adjusted her diet.  Started pilates 3 days per week.  Walking three miles per day.  Feels better.  Breathing stable.  Palpitations better.  No acid reflux.  No abdominal pain.  Bowels moving.  Blood pressure improved.     History reviewed. No pertinent past medical history. Past Surgical History:  Procedure Laterality Date  . NO PAST SURGERIES     Family History  Problem Relation Age of Onset  . Hypertension Father   . Diabetes Father   . Stroke Father 37   Social History   Social History  . Marital status: Married    Spouse name: N/A  . Number of children: N/A  . Years of education: N/A   Social History Main Topics  . Smoking status: Never Smoker  . Smokeless tobacco: Never Used  . Alcohol use 0.0 oz/week     Comment: glass of wine rarely  . Drug use: No  . Sexual activity: Yes    Partners: Male     Comment: Husband    Other Topics Concern  . None   Social History Narrative   Lives with husband    Daughter is grown and lives in [place]: None    Caffeine- No tea/coffee, dark chocolate only and occasionally    Right handed    Enjoys- HGTV    Outpatient Encounter Prescriptions as of 07/22/2016  Medication Sig  . lisinopril-hydrochlorothiazide (PRINZIDE,ZESTORETIC) 10-12.5 MG tablet Take 1 tablet by mouth daily.  . [DISCONTINUED] lisinopril-hydrochlorothiazide (PRINZIDE,ZESTORETIC) 10-12.5 MG tablet Take 1 tablet by mouth daily.   No facility-administered encounter medications on file as of 07/22/2016.     Review of Systems  Constitutional: Negative for  appetite change and unexpected weight change.  HENT: Negative for congestion and sinus pressure.   Respiratory: Negative for cough, chest tightness and shortness of breath.   Cardiovascular: Negative for chest pain, palpitations and leg swelling.  Gastrointestinal: Negative for abdominal pain, diarrhea, nausea and vomiting.  Genitourinary: Negative for difficulty urinating and dysuria.  Musculoskeletal: Negative for back pain and joint swelling.  Skin: Negative for color change and rash.  Neurological: Negative for dizziness, light-headedness and headaches.  Psychiatric/Behavioral: Negative for agitation and dysphoric mood.       Objective:     Blood pressure rechecked by me:  128/84  Physical Exam  Constitutional: She appears well-developed and well-nourished. No distress.  HENT:  Nose: Nose normal.  Mouth/Throat: Oropharynx is clear and moist.  Neck: Neck supple. No thyromegaly present.  Cardiovascular: Normal rate and regular rhythm.   Pulmonary/Chest: Breath sounds normal. No respiratory distress. She has no wheezes.  Abdominal: Soft. Bowel sounds are normal. There is no tenderness.  Musculoskeletal: She exhibits no edema or tenderness.  Lymphadenopathy:    She has no cervical adenopathy.  Skin: No rash noted. No erythema.  Psychiatric: She has a normal mood and affect. Her behavior is normal.    BP 128/84   Pulse 78   Temp 98.4 F (36.9 C) (Oral)  Resp 16   Ht 5\' 8"  (1.727 m)   Wt 148 lb 12.8 oz (67.5 kg)   LMP 07/07/2008   SpO2 98%   BMI 22.62 kg/m  Wt Readings from Last 3 Encounters:  07/22/16 148 lb 12.8 oz (67.5 kg)  07/08/16 151 lb (68.5 kg)  05/20/16 155 lb 3.2 oz (70.4 kg)     Lab Results  Component Value Date   WBC 4.5 02/06/2016   HGB 13.4 02/06/2016   HCT 40.5 02/06/2016   PLT 326.0 02/06/2016   GLUCOSE 100 (H) 07/08/2016   CHOL 201 (H) 07/08/2016   TRIG 140.0 07/08/2016   HDL 61.70 07/08/2016   LDLDIRECT 138.3 07/07/2012   LDLCALC 111 (H)  07/08/2016   ALT 14 07/08/2016   AST 16 07/08/2016   NA 142 07/08/2016   K 3.7 07/08/2016   CL 105 07/08/2016   CREATININE 0.84 07/08/2016   BUN 13 07/08/2016   CO2 28 07/08/2016   TSH 0.88 07/08/2016   HGBA1C 5.9 07/08/2016    Mm Outside Films Mammo  Result Date: 02/18/2016 CLINICAL DATA:  This exam is stored here for comparison purposes only and was performed at an outside facility.   Please contact the originating institution for any associated interpretation or report.       Assessment & Plan:   Problem List Items Addressed This Visit    Essential hypertension, benign    Blood pressure on recheck improved.  Same medication regimen.  Follow pressures.  Follow metabolic panel.        Relevant Medications   lisinopril-hydrochlorothiazide (PRINZIDE,ZESTORETIC) 10-12.5 MG tablet   Hypercholesterolemia    Low cholesterol diet and exercise.  Follow lipid panel.  Wants to work on diet and exercise.  Follow lipid panel.        Relevant Medications   lisinopril-hydrochlorothiazide (PRINZIDE,ZESTORETIC) 10-12.5 MG tablet   Stress    Handling stress.  Doing better.  Follow.            Einar Pheasant, MD

## 2016-07-22 NOTE — Progress Notes (Signed)
Pre-visit discussion using our clinic review tool. No additional management support is needed unless otherwise documented below in the visit note.  

## 2016-07-27 ENCOUNTER — Encounter: Payer: Self-pay | Admitting: Internal Medicine

## 2016-07-27 MED ORDER — LISINOPRIL-HYDROCHLOROTHIAZIDE 10-12.5 MG PO TABS: 1.0000 | ORAL_TABLET | Freq: Every day | ORAL | 1 refills | Status: DC

## 2016-07-27 NOTE — Assessment & Plan Note (Signed)
Low cholesterol diet and exercise.  Follow lipid panel.  Wants to work on diet and exercise.  Follow lipid panel.

## 2016-07-27 NOTE — Assessment & Plan Note (Signed)
Handling stress.  Doing better.  Follow.

## 2016-07-27 NOTE — Assessment & Plan Note (Signed)
Blood pressure on recheck improved.  Same medication regimen.  Follow pressures.  Follow metabolic panel.   

## 2016-08-05 DIAGNOSIS — E782 Mixed hyperlipidemia: Secondary | ICD-10-CM | POA: Diagnosis not present

## 2016-08-05 DIAGNOSIS — R002 Palpitations: Secondary | ICD-10-CM | POA: Diagnosis not present

## 2016-08-05 DIAGNOSIS — I471 Supraventricular tachycardia: Secondary | ICD-10-CM | POA: Diagnosis not present

## 2016-08-05 DIAGNOSIS — R0602 Shortness of breath: Secondary | ICD-10-CM | POA: Diagnosis not present

## 2016-08-05 DIAGNOSIS — I10 Essential (primary) hypertension: Secondary | ICD-10-CM | POA: Diagnosis not present

## 2016-08-29 DIAGNOSIS — S134XXA Sprain of ligaments of cervical spine, initial encounter: Secondary | ICD-10-CM | POA: Diagnosis not present

## 2016-09-30 ENCOUNTER — Encounter: Payer: Self-pay | Admitting: Internal Medicine

## 2016-09-30 ENCOUNTER — Ambulatory Visit (INDEPENDENT_AMBULATORY_CARE_PROVIDER_SITE_OTHER): Payer: BLUE CROSS/BLUE SHIELD | Admitting: Internal Medicine

## 2016-09-30 VITALS — BP 124/78 | HR 71 | Temp 98.4°F | Resp 12 | Ht 68.0 in | Wt 146.6 lb

## 2016-09-30 DIAGNOSIS — F439 Reaction to severe stress, unspecified: Secondary | ICD-10-CM | POA: Diagnosis not present

## 2016-09-30 DIAGNOSIS — R000 Tachycardia, unspecified: Secondary | ICD-10-CM

## 2016-09-30 DIAGNOSIS — E0789 Other specified disorders of thyroid: Secondary | ICD-10-CM | POA: Diagnosis not present

## 2016-09-30 DIAGNOSIS — Z86010 Personal history of colonic polyps: Secondary | ICD-10-CM | POA: Diagnosis not present

## 2016-09-30 DIAGNOSIS — I10 Essential (primary) hypertension: Secondary | ICD-10-CM

## 2016-09-30 DIAGNOSIS — E7800 Pure hypercholesterolemia, unspecified: Secondary | ICD-10-CM | POA: Diagnosis not present

## 2016-09-30 DIAGNOSIS — R739 Hyperglycemia, unspecified: Secondary | ICD-10-CM

## 2016-09-30 NOTE — Progress Notes (Signed)
Patient ID: SAMEERAH NACHTIGAL, female   DOB: Jan 17, 1956, 61 y.o.   MRN: [id number]   Subjective:    Patient ID: [name], female    DOB: 08-13-1955, 61 y.o.   MRN: [id number]  HPI  Patient here for a scheduled follow up.  Feeling better.  Doing well.  Staying active.  Still exercising.  Has lost weight.  Overall feels better.  No chest pain.  Just evaluated by cardiology 08/2016.  Stable.  Blood pressure is doing well.  No acid reflux.  No abdominal pain.  Bowels moving.  Handling stress.  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     History reviewed. No pertinent past medical history. Past Surgical History:  Procedure Laterality Date  . NO PAST SURGERIES     Family History  Problem Relation Age of Onset  . Hypertension Father   . Diabetes Father   . Stroke Father 32   Social History   Social History  . Marital status: Married    Spouse name: N/A  . Number of children: N/A  . Years of education: N/A   Social History Main Topics  . Smoking status: Never Smoker  . Smokeless tobacco: Never Used  . Alcohol use 0.0 oz/week     Comment: glass of wine rarely  . Drug use: No  . Sexual activity: Yes    Partners: Male     Comment: Husband    Other Topics Concern  . None   Social History Narrative   Lives with husband    Daughter is grown and lives in [place]: None    Caffeine- No tea/coffee, dark chocolate only and occasionally    Right handed    Enjoys- HGTV    Outpatient Encounter Prescriptions as of 09/30/2016  Medication Sig  . lisinopril-hydrochlorothiazide (PRINZIDE,ZESTORETIC) 10-12.5 MG tablet Take 1 tablet by mouth daily.  . metoprolol succinate (TOPROL-XL) 25 MG 24 hr tablet Take 25 mg by mouth daily.   No facility-administered encounter medications on file as of 09/30/2016.     Review of Systems  Constitutional: Negative for appetite change and unexpected weight change.  HENT: Negative for congestion and sinus pressure.   Respiratory: Negative for  cough, chest tightness and shortness of breath.   Cardiovascular: Negative for chest pain, palpitations and leg swelling.  Gastrointestinal: Negative for abdominal pain, diarrhea, nausea and vomiting.  Genitourinary: Negative for difficulty urinating and dysuria.  Musculoskeletal: Negative for back pain and joint swelling.  Skin: Negative for color change and rash.  Neurological: Negative for dizziness, light-headedness and headaches.  Psychiatric/Behavioral: Negative for agitation and dysphoric mood.       Objective:     Blood pressure rechecked by me:  124/78  Physical Exam  Constitutional: She appears well-developed and well-nourished. No distress.  HENT:  Nose: Nose normal.  Mouth/Throat: Oropharynx is clear and moist.  Neck: Neck supple.  Cardiovascular: Normal rate and regular rhythm.   Pulmonary/Chest: Breath sounds normal. No respiratory distress. She has no wheezes.  Abdominal: Soft. Bowel sounds are normal. There is no tenderness.  Musculoskeletal: She exhibits no edema or tenderness.  Lymphadenopathy:    She has no cervical adenopathy.  Skin: No rash noted. No erythema.  Psychiatric: She has a normal mood and affect. Her behavior is normal.    BP 124/78   Pulse 71   Temp 98.4 F (36.9 C) (Oral)   Resp 12   Ht 5\' 8"  (1.727 m)   Wt 146  lb 9.6 oz (66.5 kg)   LMP 07/07/2008   SpO2 98%   BMI 22.29 kg/m  Wt Readings from Last 3 Encounters:  09/30/16 146 lb 9.6 oz (66.5 kg)  07/22/16 148 lb 12.8 oz (67.5 kg)  07/08/16 151 lb (68.5 kg)     Lab Results  Component Value Date   WBC 4.5 02/06/2016   HGB 13.4 02/06/2016   HCT 40.5 02/06/2016   PLT 326.0 02/06/2016   GLUCOSE 100 (H) 07/08/2016   CHOL 201 (H) 07/08/2016   TRIG 140.0 07/08/2016   HDL 61.70 07/08/2016   LDLDIRECT 138.3 07/07/2012   LDLCALC 111 (H) 07/08/2016   ALT 14 07/08/2016   AST 16 07/08/2016   NA 142 07/08/2016   K 3.7 07/08/2016   CL 105 07/08/2016   CREATININE 0.84 07/08/2016   BUN  13 07/08/2016   CO2 28 07/08/2016   TSH 0.88 07/08/2016   HGBA1C 5.9 07/08/2016    Mm Outside Films Mammo  Result Date: 02/18/2016 CLINICAL DATA:  This exam is stored here for comparison purposes only and was performed at an outside facility.   Please contact the originating institution for any associated interpretation or report.       Assessment & Plan:   Problem List Items Addressed This Visit    Essential hypertension, benign    Blood pressure under good control.  Continue same medication regimen.  Follow pressures.  Follow metabolic panel.        Relevant Medications   metoprolol succinate (TOPROL-XL) 25 MG 24 hr tablet   Other Relevant Orders   CBC with Differential/Platelet   Comprehensive metabolic panel   History of colonic polyps    Colonoscopy 09/20/10/- hyperplastic polyps.  She will call to schedule the colonoscopy.  Has the paperwork.        Hypercholesterolemia    Last cholesterol improved.  Doing well with diet and exercise.  Follow lipid panel.        Relevant Medications   metoprolol succinate (TOPROL-XL) 25 MG 24 hr tablet   Other Relevant Orders   Lipid panel   Stress    Overall handling stress well.  Follow.        Tachycardia    Saw cardiology.  On metoprolol.  Doing well.  Follow.        Thyroid fullness    Previous ultrasound revealed multiple nodules.  Again discussed with her today.  Discussed f/u ultrasound.  She will notify me when agreeable.        Relevant Medications   metoprolol succinate (TOPROL-XL) 25 MG 24 hr tablet   Other Relevant Orders   TSH    Other Visit Diagnoses    Hyperglycemia    -  Primary   Relevant Orders   Hemoglobin A1c       Einar Pheasant, MD

## 2016-09-30 NOTE — Progress Notes (Signed)
Pre-visit discussion using our clinic review tool. No additional management support is needed unless otherwise documented below in the visit note.  

## 2016-09-30 NOTE — Assessment & Plan Note (Signed)
Overall handling stress well.  Follow.  

## 2016-09-30 NOTE — Assessment & Plan Note (Signed)
Last cholesterol improved.  Doing well with diet and exercise.  Follow lipid panel.

## 2016-09-30 NOTE — Assessment & Plan Note (Signed)
Colonoscopy 09/20/10/- hyperplastic polyps.  She will call to schedule the colonoscopy.  Has the paperwork.

## 2016-09-30 NOTE — Assessment & Plan Note (Signed)
Previous ultrasound revealed multiple nodules.  Again discussed with her today.  Discussed f/u ultrasound.  She will notify me when agreeable.

## 2016-09-30 NOTE — Assessment & Plan Note (Signed)
Blood pressure under good control.  Continue same medication regimen.  Follow pressures.  Follow metabolic panel.   

## 2016-09-30 NOTE — Assessment & Plan Note (Signed)
Saw cardiology.  On metoprolol.  Doing well.  Follow.

## 2016-10-02 ENCOUNTER — Other Ambulatory Visit: Payer: Self-pay

## 2016-10-02 MED ORDER — LISINOPRIL-HYDROCHLOROTHIAZIDE 10-12.5 MG PO TABS: 1.0000 | ORAL_TABLET | Freq: Every day | ORAL | 1 refills | Status: DC

## 2016-12-03 DIAGNOSIS — E782 Mixed hyperlipidemia: Secondary | ICD-10-CM | POA: Diagnosis not present

## 2016-12-03 DIAGNOSIS — I10 Essential (primary) hypertension: Secondary | ICD-10-CM | POA: Diagnosis not present

## 2016-12-03 DIAGNOSIS — I471 Supraventricular tachycardia: Secondary | ICD-10-CM | POA: Diagnosis not present

## 2016-12-03 DIAGNOSIS — R002 Palpitations: Secondary | ICD-10-CM | POA: Diagnosis not present

## 2016-12-12 NOTE — Telephone Encounter (Signed)
error 

## 2017-02-16 ENCOUNTER — Ambulatory Visit: Payer: BLUE CROSS/BLUE SHIELD | Admitting: Internal Medicine

## 2017-02-16 ENCOUNTER — Encounter: Payer: BLUE CROSS/BLUE SHIELD | Admitting: Internal Medicine

## 2017-02-16 DIAGNOSIS — Z0289 Encounter for other administrative examinations: Secondary | ICD-10-CM

## 2017-03-10 ENCOUNTER — Other Ambulatory Visit: Payer: Self-pay | Admitting: Internal Medicine

## 2017-03-10 DIAGNOSIS — Z1231 Encounter for screening mammogram for malignant neoplasm of breast: Secondary | ICD-10-CM

## 2017-04-01 ENCOUNTER — Ambulatory Visit
Admission: RE | Admit: 2017-04-01 | Discharge: 2017-04-01 | Disposition: A | Discharge status: Home / Self Care | Payer: BLUE CROSS/BLUE SHIELD | Source: Ambulatory Visit | Attending: Internal Medicine | Admitting: Internal Medicine

## 2017-04-01 DIAGNOSIS — Z1231 Encounter for screening mammogram for malignant neoplasm of breast: Secondary | ICD-10-CM | POA: Diagnosis not present

## 2017-05-25 ENCOUNTER — Encounter: Payer: Self-pay | Admitting: Internal Medicine

## 2017-05-25 ENCOUNTER — Ambulatory Visit (INDEPENDENT_AMBULATORY_CARE_PROVIDER_SITE_OTHER): Payer: BLUE CROSS/BLUE SHIELD | Admitting: Internal Medicine

## 2017-05-25 VITALS — BP 128/84 | HR 85 | Temp 98.3°F | Resp 16 | Wt 154.8 lb

## 2017-05-25 DIAGNOSIS — I10 Essential (primary) hypertension: Secondary | ICD-10-CM | POA: Diagnosis not present

## 2017-05-25 DIAGNOSIS — F439 Reaction to severe stress, unspecified: Secondary | ICD-10-CM | POA: Diagnosis not present

## 2017-05-25 DIAGNOSIS — R739 Hyperglycemia, unspecified: Secondary | ICD-10-CM

## 2017-05-25 DIAGNOSIS — R0981 Nasal congestion: Secondary | ICD-10-CM

## 2017-05-25 DIAGNOSIS — Z0000 Encounter for general adult medical examination without abnormal findings: Secondary | ICD-10-CM | POA: Diagnosis not present

## 2017-05-25 DIAGNOSIS — E7800 Pure hypercholesterolemia, unspecified: Secondary | ICD-10-CM

## 2017-05-25 LAB — LIPID PANEL
Cholesterol: 225 mg/dL — ABNORMAL HIGH (ref 0–200)
HDL: 73.7 mg/dL (ref >39.00)
LDL Cholesterol: 123 mg/dL — ABNORMAL HIGH (ref 0–99)
NonHDL: 150.98
Total CHOL/HDL Ratio: 3
Triglycerides: 138 mg/dL (ref 0.0–149.0)
VLDL: 27.6 mg/dL (ref 0.0–40.0)

## 2017-05-25 LAB — BASIC METABOLIC PANEL
BUN: 12 mg/dL (ref 6–23)
CO2: 30 mEq/L (ref 19–32)
Calcium: 9.5 mg/dL (ref 8.4–10.5)
Chloride: 102 mEq/L (ref 96–112)
Creatinine, Ser: 0.86 mg/dL (ref 0.40–1.20)
GFR: 86.54 mL/min (ref >60.00)
Glucose, Bld: 81 mg/dL (ref 70–99)
Potassium: 3.4 mEq/L — ABNORMAL LOW (ref 3.5–5.1)
Sodium: 141 mEq/L (ref 135–145)

## 2017-05-25 LAB — HEMOGLOBIN A1C: Hgb A1c MFr Bld: 5.9 % (ref 4.6–6.5)

## 2017-05-25 LAB — HEPATIC FUNCTION PANEL
ALT: 19 U/L (ref 0–35)
AST: 21 U/L (ref 0–37)
Albumin: 4.6 g/dL (ref 3.5–5.2)
Alkaline Phosphatase: 66 U/L (ref 39–117)
Bilirubin, Direct: 0.1 mg/dL (ref 0.0–0.3)
Total Bilirubin: 0.5 mg/dL (ref 0.2–1.2)
Total Protein: 8 g/dL (ref 6.0–8.3)

## 2017-05-25 NOTE — Progress Notes (Signed)
Patient ID: Cheryl Powers, female   DOB: 1956/03/18, 62 y.o.   MRN: [id number]   Subjective:    Patient ID: Cheryl Powers, female    DOB: May 31, 1955, 62 y.o.   MRN: [id number]  HPI  Patient here for her physical exam.  She reports she is doing relatively well.  Got a new position - [employer] cabin [employer].  Excited about this.  Still with increased stress.  Overall she feels she is handling things relatively well.  Not exercising as much.  Just stay active.  Plans to start more formal exercise again.  No chest pain.  No sob.  No acid reflux. No abdominal pain.  Bowels moving.  Some nasal congestion.  No fever.  No chest congestion.     Past Medical History:  Diagnosis Date  . Hypercholesterolemia   . Hypertension    Past Surgical History:  Procedure Laterality Date  . NO PAST SURGERIES     Family History  Problem Relation Age of Onset  . Hypertension Father   . Diabetes Father   . Stroke Father 63   Social History   Socioeconomic History  . Marital status: Married    Spouse name: None  . Number of children: None  . Years of education: None  . Highest education level: None  Social Needs  . Financial resource strain: None  . Food insecurity - worry: None  . Food insecurity - inability: None  . Transportation needs - medical: None  . Transportation needs - non-medical: None  Occupational History  . None  Tobacco Use  . Smoking status: Never Smoker  . Smokeless tobacco: Never Used  Substance and Sexual Activity  . Alcohol use: Yes    Alcohol/week: 0.0 oz    Comment: glass of wine rarely  . Drug use: No  . Sexual activity: Yes    Partners: Male    Comment: Husband   Other Topics Concern  . None  Social History Narrative   Lives with husband    Daughter is grown and lives in [place]: None    Caffeine- No tea/coffee, dark chocolate only and occasionally    Right handed    Enjoys- HGTV    Outpatient Encounter Medications as of 05/25/2017  Medication Sig  .  lisinopril-hydrochlorothiazide (PRINZIDE,ZESTORETIC) 10-12.5 MG tablet Take 1 tablet by mouth daily.  . metoprolol succinate (TOPROL-XL) 25 MG 24 hr tablet Take 0.5 tablets by mouth daily.  . [DISCONTINUED] metoprolol succinate (TOPROL-XL) 25 MG 24 hr tablet Take 25 mg by mouth daily.   No facility-administered encounter medications on file as of 05/25/2017.     Review of Systems  Constitutional: Negative for appetite change.       Has gained some weight.  Still trying to watch her diet.    HENT: Negative for congestion and sinus pressure.   Eyes: Negative for pain and visual disturbance.  Respiratory: Negative for cough, chest tightness and shortness of breath.   Cardiovascular: Negative for chest pain, palpitations and leg swelling.  Gastrointestinal: Negative for abdominal pain, diarrhea, nausea and vomiting.  Genitourinary: Negative for difficulty urinating and dysuria.  Musculoskeletal: Negative for joint swelling and myalgias.  Skin: Negative for color change and rash.  Neurological: Negative for dizziness, light-headedness and headaches.  Hematological: Negative for adenopathy. Does not bruise/bleed easily.  Psychiatric/Behavioral: Negative for agitation and dysphoric mood.       Objective:     Blood pressure rechecked by me;  128/84  Physical Exam  Constitutional: She is oriented to person, place, and time. She appears well-developed and well-nourished. No distress.  HENT:  Nose: Nose normal.  Mouth/Throat: Oropharynx is clear and moist.  Eyes: Right eye exhibits no discharge. Left eye exhibits no discharge. No scleral icterus.  Neck: Neck supple.  Cardiovascular: Normal rate and regular rhythm.  Pulmonary/Chest: Breath sounds normal. No accessory muscle usage. No tachypnea. No respiratory distress. She has no decreased breath sounds. She has no wheezes. She has no rhonchi. Right breast exhibits no inverted nipple, no mass, no nipple discharge and no tenderness (no axillary  adenopathy). Left breast exhibits no inverted nipple, no mass, no nipple discharge and no tenderness (no axilarry adenopathy).  Abdominal: Soft. Bowel sounds are normal. There is no tenderness.  Genitourinary:  Genitourinary Comments: She wanted to hold on pap.  States will do at next visit.   Musculoskeletal: She exhibits no edema or tenderness.  Lymphadenopathy:    She has no cervical adenopathy.  Neurological: She is alert and oriented to person, place, and time.  Skin: Skin is warm. No rash noted. No erythema.  Psychiatric: She has a normal mood and affect. Her behavior is normal.    BP 128/84   Pulse 85   Temp 98.3 F (36.8 C) (Oral)   Resp 16   Wt 154 lb 12.8 oz (70.2 kg)   LMP 07/07/2008   SpO2 98%   BMI 23.54 kg/m  Wt Readings from Last 3 Encounters:  05/25/17 154 lb 12.8 oz (70.2 kg)  09/30/16 146 lb 9.6 oz (66.5 kg)  07/22/16 148 lb 12.8 oz (67.5 kg)     Lab Results  Component Value Date   WBC 4.5 02/06/2016   HGB 13.4 02/06/2016   HCT 40.5 02/06/2016   PLT 326.0 02/06/2016   GLUCOSE 81 05/25/2017   CHOL 225 (H) 05/25/2017   TRIG 138.0 05/25/2017   HDL 73.70 05/25/2017   LDLDIRECT 138.3 07/07/2012   LDLCALC 123 (H) 05/25/2017   ALT 19 05/25/2017   AST 21 05/25/2017   NA 141 05/25/2017   K 3.4 (L) 05/25/2017   CL 102 05/25/2017   CREATININE 0.86 05/25/2017   BUN 12 05/25/2017   CO2 30 05/25/2017   TSH 0.88 07/08/2016   HGBA1C 5.9 05/25/2017    Mm Screening Breast Tomo Bilateral  Result Date: 04/01/2017 CLINICAL DATA:  Screening. EXAM: 2D DIGITAL SCREENING BILATERAL MAMMOGRAM WITH CAD AND ADJUNCT TOMO COMPARISON:  Previous exam(s). ACR Breast Density Category c: The breast tissue is heterogeneously dense, which may obscure small masses. FINDINGS: There are no findings suspicious for malignancy. Images were processed with CAD. IMPRESSION: No mammographic evidence of malignancy. A result letter of this screening mammogram will be mailed directly to the  patient. RECOMMENDATION: Screening mammogram in one year. (Code:SM-B-01Y) BI-RADS CATEGORY  1: Negative. Electronically Signed   By: Kristopher Oppenheim M.D.   On: 04/01/2017 10:14       Assessment & Plan:   Problem List Items Addressed This Visit    Essential hypertension, benign    Blood pressure under good control.  Continue same medication regimen.  Follow pressures.  Follow metabolic panel.        Relevant Medications   metoprolol succinate (TOPROL-XL) 25 MG 24 hr tablet   Healthcare maintenance    Physical today 05/25/17.  Mammogram 04/01/17 - Birads I.  Last colonoscopy 09/2010.  Overdue.  She was to call and reschedule.        Hypercholesterolemia    Low cholesterol diet  and exercise.  Follow lipid panel.  Recheck today.        Relevant Medications   metoprolol succinate (TOPROL-XL) 25 MG 24 hr tablet   Stress    Increased stress as outlined.  Overall she feels she is handling things relatively well.  Follow.         Other Visit Diagnoses    Routine general medical examination at a health care facility    -  Primary   Hyperglycemia       Nasal congestion       Saline and steroid nasal spray as outlined.  robitussin as directed.  follow.         Einar Pheasant, MD

## 2017-05-25 NOTE — Patient Instructions (Addendum)
Saline nasal spray - flush nose at least 2-3x/day  nasacort nasal spray - 2 sprays each nostril one time per day.  Do this in the evening.    Robitussin (or robitussin DM) twice a day as needed for cough and congestion.

## 2017-05-26 ENCOUNTER — Other Ambulatory Visit: Payer: Self-pay | Admitting: Internal Medicine

## 2017-05-26 ENCOUNTER — Encounter: Payer: Self-pay | Admitting: Internal Medicine

## 2017-05-26 DIAGNOSIS — E876 Hypokalemia: Secondary | ICD-10-CM

## 2017-05-26 NOTE — Assessment & Plan Note (Signed)
Low cholesterol diet and exercise.  Follow lipid panel.  Recheck today.  °

## 2017-05-26 NOTE — Progress Notes (Signed)
Order placed for f/u potassium.  

## 2017-05-26 NOTE — Assessment & Plan Note (Addendum)
Physical today 05/25/17.  Mammogram 04/01/17 - Birads I.  Last colonoscopy 09/2010.  Overdue.  She was to call and reschedule.

## 2017-05-26 NOTE — Assessment & Plan Note (Signed)
Blood pressure under good control.  Continue same medication regimen.  Follow pressures.  Follow metabolic panel.   

## 2017-05-26 NOTE — Assessment & Plan Note (Signed)
Increased stress as outlined.  Overall she feels she is handling things relatively well.  Follow.

## 2017-05-27 ENCOUNTER — Encounter: Payer: BLUE CROSS/BLUE SHIELD | Admitting: Internal Medicine

## 2017-06-08 ENCOUNTER — Other Ambulatory Visit (INDEPENDENT_AMBULATORY_CARE_PROVIDER_SITE_OTHER): Payer: BLUE CROSS/BLUE SHIELD

## 2017-06-08 DIAGNOSIS — E7800 Pure hypercholesterolemia, unspecified: Secondary | ICD-10-CM

## 2017-06-08 DIAGNOSIS — I10 Essential (primary) hypertension: Secondary | ICD-10-CM

## 2017-06-08 DIAGNOSIS — E876 Hypokalemia: Secondary | ICD-10-CM

## 2017-06-08 DIAGNOSIS — E0789 Other specified disorders of thyroid: Secondary | ICD-10-CM | POA: Diagnosis not present

## 2017-06-08 LAB — COMPREHENSIVE METABOLIC PANEL
ALT: 17 U/L (ref 0–35)
AST: 19 U/L (ref 0–37)
Albumin: 4.3 g/dL (ref 3.5–5.2)
Alkaline Phosphatase: 60 U/L (ref 39–117)
BUN: 10 mg/dL (ref 6–23)
CO2: 28 mEq/L (ref 19–32)
Calcium: 9.3 mg/dL (ref 8.4–10.5)
Chloride: 104 mEq/L (ref 96–112)
Creatinine, Ser: 0.92 mg/dL (ref 0.40–1.20)
GFR: 80.05 mL/min (ref >60.00)
Glucose, Bld: 99 mg/dL (ref 70–99)
Potassium: 3.9 mEq/L (ref 3.5–5.1)
Sodium: 139 mEq/L (ref 135–145)
Total Bilirubin: 0.5 mg/dL (ref 0.2–1.2)
Total Protein: 7.4 g/dL (ref 6.0–8.3)

## 2017-06-08 LAB — LIPID PANEL
Cholesterol: 189 mg/dL (ref 0–200)
HDL: 57.6 mg/dL (ref >39.00)
LDL Cholesterol: 105 mg/dL — ABNORMAL HIGH (ref 0–99)
NonHDL: 131.36
Total CHOL/HDL Ratio: 3
Triglycerides: 131 mg/dL (ref 0.0–149.0)
VLDL: 26.2 mg/dL (ref 0.0–40.0)

## 2017-06-08 LAB — CBC WITH DIFFERENTIAL/PLATELET
Basophils Absolute: 0 10*3/uL (ref 0.0–0.1)
Basophils Relative: 0.6 % (ref 0.0–3.0)
Eosinophils Absolute: 0.1 10*3/uL (ref 0.0–0.7)
Eosinophils Relative: 1.9 % (ref 0.0–5.0)
HCT: 36.8 % (ref 36.0–46.0)
Hemoglobin: 12.5 g/dL (ref 12.0–15.0)
Lymphocytes Relative: 39.2 % (ref 12.0–46.0)
Lymphs Abs: 1.3 10*3/uL (ref 0.7–4.0)
MCHC: 34.1 g/dL (ref 30.0–36.0)
MCV: 80.1 fl (ref 78.0–100.0)
Monocytes Absolute: 0.2 10*3/uL (ref 0.1–1.0)
Monocytes Relative: 7 % (ref 3.0–12.0)
Neutro Abs: 1.8 10*3/uL (ref 1.4–7.7)
Neutrophils Relative %: 51.3 % (ref 43.0–77.0)
Platelets: 313 10*3/uL (ref 150.0–400.0)
RBC: 4.59 Mil/uL (ref 3.87–5.11)
RDW: 13.3 % (ref 11.5–15.5)
WBC: 3.4 10*3/uL — ABNORMAL LOW (ref 4.0–10.5)

## 2017-06-08 LAB — TSH: TSH: 0.79 u[IU]/mL (ref 0.35–4.50)

## 2017-06-09 NOTE — Addendum Note (Signed)
Addended by: Arby Barrette on: 06/09/2017 08:44 AM   Modules accepted: Orders

## 2017-07-02 ENCOUNTER — Other Ambulatory Visit: Payer: Self-pay | Admitting: Internal Medicine

## 2017-08-27 ENCOUNTER — Ambulatory Visit (INDEPENDENT_AMBULATORY_CARE_PROVIDER_SITE_OTHER): Payer: Self-pay | Admitting: Internal Medicine

## 2017-08-27 ENCOUNTER — Other Ambulatory Visit (HOSPITAL_COMMUNITY)
Admission: RE | Admit: 2017-08-27 | Discharge: 2017-08-27 | Disposition: A | Discharge status: Home / Self Care | Payer: BLUE CROSS/BLUE SHIELD | Source: Ambulatory Visit | Attending: Internal Medicine | Admitting: Internal Medicine

## 2017-08-27 ENCOUNTER — Encounter: Payer: Self-pay | Admitting: Internal Medicine

## 2017-08-27 DIAGNOSIS — Z124 Encounter for screening for malignant neoplasm of cervix: Secondary | ICD-10-CM | POA: Diagnosis not present

## 2017-08-27 DIAGNOSIS — N841 Polyp of cervix uteri: Secondary | ICD-10-CM

## 2017-08-27 DIAGNOSIS — R739 Hyperglycemia, unspecified: Secondary | ICD-10-CM

## 2017-08-27 DIAGNOSIS — I10 Essential (primary) hypertension: Secondary | ICD-10-CM

## 2017-08-27 DIAGNOSIS — E7800 Pure hypercholesterolemia, unspecified: Secondary | ICD-10-CM

## 2017-08-27 DIAGNOSIS — F439 Reaction to severe stress, unspecified: Secondary | ICD-10-CM

## 2017-08-27 NOTE — Progress Notes (Signed)
Patient ID: MEKIA DIPINTO, female   DOB: May 30, 1955, 62 y.o.   MRN: [id number]   Subjective:    Patient ID: [name], female    DOB: 11-May-1955, 62 y.o.   MRN: [id number]  HPI  Patient here for a scheduled follow up and for a pap smear.   She reports she is doing relatively well.  New job - going well.  Stress is better.  Trying to stay active.  Not doing a lot of formal exercise, but does stay active.  No chest pain.  No sob.  No acid reflux.  No abdominal pain.  Bowels moving.  No vaginal bleeding.     Past Medical History:  Diagnosis Date  . Hypercholesterolemia   . Hypertension    Past Surgical History:  Procedure Laterality Date  . NO PAST SURGERIES     Family History  Problem Relation Age of Onset  . Hypertension Father   . Diabetes Father   . Stroke Father 19   Social History   Socioeconomic History  . Marital status: Married    Spouse name: Not on file  . Number of children: Not on file  . Years of education: Not on file  . Highest education level: Not on file  Occupational History  . Not on file  Social Needs  . Financial resource strain: Not on file  . Food insecurity:    Worry: Not on file    Inability: Not on file  . Transportation needs:    Medical: Not on file    Non-medical: Not on file  Tobacco Use  . Smoking status: Never Smoker  . Smokeless tobacco: Never Used  Substance and Sexual Activity  . Alcohol use: Yes    Alcohol/week: 0.0 oz    Comment: glass of wine rarely  . Drug use: No  . Sexual activity: Yes    Partners: Male    Comment: Husband   Lifestyle  . Physical activity:    Days per week: Not on file    Minutes per session: Not on file  . Stress: Not on file  Relationships  . Social connections:    Talks on phone: Not on file    Gets together: Not on file    Attends religious service: Not on file    Active member of club or organization: Not on file    Attends meetings of clubs or organizations: Not on file   Relationship status: Not on file  Other Topics Concern  . Not on file  Social History Narrative   Lives with husband    Daughter is grown and lives in [place]: None    Caffeine- No tea/coffee, dark chocolate only and occasionally    Right handed    Enjoys- HGTV    Outpatient Encounter Medications as of 08/27/2017  Medication Sig  . lisinopril-hydrochlorothiazide (PRINZIDE,ZESTORETIC) 10-12.5 MG tablet TAKE 1 TABLET BY MOUTH ONCE DAILY  . [DISCONTINUED] metoprolol succinate (TOPROL-XL) 25 MG 24 hr tablet Take 0.5 tablets by mouth daily.   No facility-administered encounter medications on file as of 08/27/2017.     Review of Systems  Constitutional: Negative for appetite change and unexpected weight change.  HENT: Negative for congestion and sinus pressure.   Eyes: Negative for pain and visual disturbance.  Respiratory: Negative for cough, chest tightness and shortness of breath.   Cardiovascular: Negative for chest pain, palpitations and leg swelling.  Gastrointestinal: Negative for abdominal pain, diarrhea, nausea and vomiting.  Genitourinary:  Negative for difficulty urinating and dysuria.  Musculoskeletal: Negative for joint swelling and myalgias.  Skin: Negative for color change and rash.  Neurological: Negative for dizziness, light-headedness and headaches.  Hematological: Negative for adenopathy. Does not bruise/bleed easily.  Psychiatric/Behavioral: Negative for agitation and dysphoric mood.       Objective:    Physical Exam  Constitutional: She appears well-developed and well-nourished. No distress.  HENT:  Nose: Nose normal.  Mouth/Throat: Oropharynx is clear and moist.  Neck: Neck supple. No thyromegaly present.  Cardiovascular: Normal rate and regular rhythm.  Pulmonary/Chest: Breath sounds normal. No respiratory distress. She has no wheezes.  Abdominal: Soft. Bowel sounds are normal. There is no tenderness.  Genitourinary:  Genitourinary Comments: Normal  external genitalia.  Vaginal vault without lesions.  Cervix identified.  Pap smear performed.  Question of cervical polyp.  Could not appreciate any adnexal masses or tenderness.    Musculoskeletal: She exhibits no edema or tenderness.  Lymphadenopathy:    She has no cervical adenopathy.  Skin: No rash noted. No erythema.  Psychiatric: She has a normal mood and affect. Her behavior is normal.    BP 122/60 (BP Location: Left Arm, Patient Position: Sitting, Cuff Size: Normal)   Pulse 80   Temp 98.1 F (36.7 C) (Oral)   Resp 18   Wt 145 lb (65.8 kg)   LMP 07/07/2008   SpO2 98%   BMI 22.05 kg/m  Wt Readings from Last 3 Encounters:  08/27/17 145 lb (65.8 kg)  05/25/17 154 lb 12.8 oz (70.2 kg)  09/30/16 146 lb 9.6 oz (66.5 kg)     Lab Results  Component Value Date   WBC 3.4 (L) 06/08/2017   HGB 12.5 06/08/2017   HCT 36.8 06/08/2017   PLT 313.0 06/08/2017   GLUCOSE 99 06/08/2017   CHOL 189 06/08/2017   TRIG 131.0 06/08/2017   HDL 57.60 06/08/2017   LDLDIRECT 138.3 07/07/2012   LDLCALC 105 (H) 06/08/2017   ALT 17 06/08/2017   AST 19 06/08/2017   NA 139 06/08/2017   K 3.9 06/08/2017   CL 104 06/08/2017   CREATININE 0.92 06/08/2017   BUN 10 06/08/2017   CO2 28 06/08/2017   TSH 0.79 06/08/2017   HGBA1C 5.9 05/25/2017    Mm Screening Breast Tomo Bilateral  Result Date: 04/01/2017 CLINICAL DATA:  Screening. EXAM: 2D DIGITAL SCREENING BILATERAL MAMMOGRAM WITH CAD AND ADJUNCT TOMO COMPARISON:  Previous exam(s). ACR Breast Density Category c: The breast tissue is heterogeneously dense, which may obscure small masses. FINDINGS: There are no findings suspicious for malignancy. Images were processed with CAD. IMPRESSION: No mammographic evidence of malignancy. A result letter of this screening mammogram will be mailed directly to the patient. RECOMMENDATION: Screening mammogram in one year. (Code:SM-B-01Y) BI-RADS CATEGORY  1: Negative. Electronically Signed   By: Kristopher Oppenheim M.D.    On: 04/01/2017 10:14       Assessment & Plan:   Problem List Items Addressed This Visit    Cervical polyp    Has cervical polyp noted on exam.  Refer to GYN.        Relevant Orders   Ambulatory referral to Gynecology   Essential hypertension, benign    Blood pressure under good control.  Continue same medication regimen.  Follow pressures.  Follow metabolic panel.        Relevant Orders   Basic metabolic panel   Hypercholesterolemia    Low cholesterol diet and exercise.  Follow lipid panel.  Relevant Orders   Hepatic function panel   Lipid panel   Stress    Stress is better with new job.  Follow.         Other Visit Diagnoses    Screening for cervical cancer       PAP performed today.    Relevant Orders   Cytology - PAP (Completed)   Hyperglycemia       Relevant Orders   Hemoglobin A1c       Einar Pheasant, MD

## 2017-08-28 LAB — CYTOLOGY - PAP
Diagnosis: NEGATIVE
HPV: NOT DETECTED

## 2017-08-29 ENCOUNTER — Encounter: Payer: Self-pay | Admitting: Internal Medicine

## 2017-08-29 DIAGNOSIS — N841 Polyp of cervix uteri: Secondary | ICD-10-CM | POA: Insufficient documentation

## 2017-08-29 NOTE — Assessment & Plan Note (Signed)
Low cholesterol diet and exercise.  Follow lipid panel.   

## 2017-08-29 NOTE — Assessment & Plan Note (Signed)
Has cervical polyp noted on exam.  Refer to GYN.

## 2017-08-29 NOTE — Assessment & Plan Note (Signed)
Blood pressure under good control.  Continue same medication regimen.  Follow pressures.  Follow metabolic panel.   

## 2017-08-29 NOTE — Assessment & Plan Note (Signed)
Stress is better with new job.  Follow.

## 2017-09-01 ENCOUNTER — Telehealth: Payer: Self-pay | Admitting: Obstetrics and Gynecology

## 2017-09-01 NOTE — Telephone Encounter (Signed)
[facility] referring for Cervical polyp.patient prefers to be seen by Dr. Gilman Schmidt. Called and left voicemail for patient to call back to be schedule

## 2017-09-02 NOTE — Telephone Encounter (Signed)
Called and left voice mail for patient to call back to be schedule °

## 2017-09-03 NOTE — Telephone Encounter (Signed)
Patient is schedule 09/24/17 with Dr. Gilman Schmidt

## 2017-09-24 ENCOUNTER — Encounter: Payer: Self-pay | Admitting: Obstetrics and Gynecology

## 2017-10-07 ENCOUNTER — Encounter: Payer: Self-pay | Admitting: Obstetrics and Gynecology

## 2017-10-27 ENCOUNTER — Encounter: Payer: Self-pay | Admitting: Obstetrics and Gynecology

## 2017-10-27 ENCOUNTER — Ambulatory Visit: Payer: BLUE CROSS/BLUE SHIELD | Admitting: Obstetrics and Gynecology

## 2017-10-27 ENCOUNTER — Other Ambulatory Visit (HOSPITAL_COMMUNITY)
Admission: RE | Admit: 2017-10-27 | Discharge: 2017-10-27 | Disposition: A | Discharge status: Home / Self Care | Payer: BLUE CROSS/BLUE SHIELD | Source: Ambulatory Visit | Attending: Obstetrics and Gynecology | Admitting: Obstetrics and Gynecology

## 2017-10-27 VITALS — BP 122/82 | HR 75 | Ht 66.5 in | Wt 150.0 lb

## 2017-10-27 DIAGNOSIS — N889 Noninflammatory disorder of cervix uteri, unspecified: Secondary | ICD-10-CM | POA: Diagnosis not present

## 2017-10-27 DIAGNOSIS — N841 Polyp of cervix uteri: Secondary | ICD-10-CM | POA: Insufficient documentation

## 2017-10-27 NOTE — Progress Notes (Signed)
Patient ID: Cheryl Powers, female   DOB: 13-Nov-1955, 62 y.o.   MRN: [id number]  Reason for Consult: Cervical polyp (Referred by Einar Pheasant)   Referred by Einar Pheasant, MD  Subjective:     HPI:  Cheryl Powers is a 62 y.o. female . She presents today as a referral from her primary care physician for a cervical polyp. The polyp was seen on recent speculum examination for a pap smear. The pap smear was normal. She has not had any bleeding or spotting. She denies pelvic pain or cramping. She reports that in the past she has had two other polyps removed in 2003 and 2009.   Past Medical History:  Diagnosis Date  . Hypercholesterolemia   . Hypertension    Family History  Problem Relation Age of Onset  . Hypertension Father   . Stroke Father 90  . Heart attack Father   . Diabetes Father        TYPE 2  . Hyperlipidemia Father   . Brain cancer Mother 1       Benign   Past Surgical History:  Procedure Laterality Date  . CERVICAL POLYP REMOVAL  2003, 2009  . COLONOSCOPY  2012    Short Social History:  Social History   Tobacco Use  . Smoking status: Never Smoker  . Smokeless tobacco: Never Used  Substance Use Topics  . Alcohol use: Yes    Alcohol/week: 0.0 oz    Comment: glass of wine rarely    No Known Allergies  Current Outpatient Medications  Medication Sig Dispense Refill  . lisinopril-hydrochlorothiazide (PRINZIDE,ZESTORETIC) 10-12.5 MG tablet TAKE 1 TABLET BY MOUTH ONCE DAILY 30 tablet 5   No current facility-administered medications for this visit.     Review of Systems  Constitutional: Negative for chills, fatigue, fever and unexpected weight change.  HENT: Negative for trouble swallowing.  Eyes: Negative for loss of vision.  Respiratory: Negative for cough, shortness of breath and wheezing.  Cardiovascular: Negative for chest pain, leg swelling, palpitations and syncope.  GI: Negative for abdominal pain, blood in stool, diarrhea, nausea and  vomiting.  GU: Negative for difficulty urinating, dysuria, frequency and hematuria.  Musculoskeletal: Negative for back pain, leg pain and joint pain.  Skin: Negative for rash.  Neurological: Negative for dizziness, headaches, light-headedness, numbness and seizures.  Psychiatric: Negative for behavioral problem, confusion, depressed mood and sleep disturbance.        Objective:  Objective   Vitals:   10/27/17 0809  BP: 122/82  Pulse: 75  Weight: 150 lb (68 kg)  Height: 5' 6.5" (1.689 m)   Body mass index is 23.85 kg/m.  Physical Exam  Constitutional: She is oriented to person, place, and time. She appears well-developed and well-nourished.  HENT:  Head: Normocephalic and atraumatic.  Eyes: Pupils are equal, round, and reactive to light. EOM are normal.  Cardiovascular: Normal rate.  Pulmonary/Chest: Effort normal. No respiratory distress.  Neurological: She is alert and oriented to person, place, and time.  Skin: Skin is warm and dry.  Psychiatric: She has a normal mood and affect. Her behavior is normal. Judgment and thought content normal.  Nursing note and vitals reviewed.   Procedure:  Cervix was washed with betadine. The polyp was grasped with a Allis and a ring forcep. It was spun to try and free the tissue from the stalk, but it was difficult to remove despite multiple attempts. Small fragments were obtained and sent to pathology.  Assessment/Plan:    62 yo with a cervical polyp.  Polyp removal attempted. Only small fragments were able to be removed. Recommended that we go to the OR to complete the polypectomy. Discussed that polyps are generally benign but that they can have an background of hyperplasia or endometrial cancer. [name] declined at this time but will consider over the next two weeks. Fragments of tissue sent to pathology for further evaluation.   Adrian Prows MD [facility] 10/27/17 8:52 AM

## 2017-10-29 NOTE — Progress Notes (Signed)
Called and informed patient of result. The polyp was not removed in total. I have offered to take her to the OR to remove it completely, but she has declined and will follow up if it becomes bothersome or caused bleeding.

## 2017-12-08 DIAGNOSIS — I471 Supraventricular tachycardia: Secondary | ICD-10-CM | POA: Diagnosis not present

## 2017-12-08 DIAGNOSIS — I10 Essential (primary) hypertension: Secondary | ICD-10-CM | POA: Diagnosis not present

## 2017-12-08 DIAGNOSIS — R002 Palpitations: Secondary | ICD-10-CM | POA: Diagnosis not present

## 2017-12-08 DIAGNOSIS — E782 Mixed hyperlipidemia: Secondary | ICD-10-CM | POA: Diagnosis not present

## 2017-12-31 ENCOUNTER — Other Ambulatory Visit: Payer: Self-pay | Admitting: Internal Medicine

## 2018-01-05 ENCOUNTER — Encounter: Payer: Self-pay | Admitting: Internal Medicine

## 2018-01-05 ENCOUNTER — Ambulatory Visit: Payer: BLUE CROSS/BLUE SHIELD | Admitting: Internal Medicine

## 2018-01-05 DIAGNOSIS — E0789 Other specified disorders of thyroid: Secondary | ICD-10-CM | POA: Diagnosis not present

## 2018-01-05 DIAGNOSIS — I10 Essential (primary) hypertension: Secondary | ICD-10-CM

## 2018-01-05 DIAGNOSIS — N841 Polyp of cervix uteri: Secondary | ICD-10-CM | POA: Diagnosis not present

## 2018-01-05 DIAGNOSIS — E7800 Pure hypercholesterolemia, unspecified: Secondary | ICD-10-CM

## 2018-01-05 DIAGNOSIS — Z86010 Personal history of colonic polyps: Secondary | ICD-10-CM | POA: Diagnosis not present

## 2018-01-05 DIAGNOSIS — F439 Reaction to severe stress, unspecified: Secondary | ICD-10-CM

## 2018-01-05 NOTE — Assessment & Plan Note (Signed)
Colonoscopy 09/2010 - rectal polyp (hyperplastic).  Overdue f/u colonoscopy.  Order placed for referral.

## 2018-01-05 NOTE — Assessment & Plan Note (Signed)
Discussed with her today.  Previous biopsy.  Wanted to pursue f/u thyroid ultrasound.  She declines.  Will notify me when agreeable.

## 2018-01-05 NOTE — Progress Notes (Signed)
Patient ID: Cheryl Powers, female   DOB: [birth year], 62 y.o.   MRN: [id number]   Subjective:    Patient ID: Cheryl Powers, female    DOB: [birth year], 62 y.o.   MRN: [id number]  HPI  Patient here for a scheduled follow up.  States she is doing well. Enjoying her new job.  Her boss just resigned yesterday.  Some increased stress related to this.  Discussed with her today.  Overall she feels she is handling things relatively well.  No chest pain.  Exercises.  No sob.  No acid reflux.  No problems swallowing.  No abdominal pain.  Bowels moving.  Some intermittent left ear fullness.  No pain.  No fullness today.  Some intermittent nasal congestion.  Does not feel needs anything more at this time.     Past Medical History:  Diagnosis Date  . Hypercholesterolemia   . Hypertension    Past Surgical History:  Procedure Laterality Date  . CERVICAL POLYP REMOVAL  2003, 2009  . COLONOSCOPY  2012   Family History  Problem Relation Age of Onset  . Hypertension Father   . Stroke Father 76  . Heart attack Father   . Diabetes Father        TYPE 2  . Hyperlipidemia Father   . Brain cancer Mother 51       Benign   Social History   Socioeconomic History  . Marital status: Married    Spouse name: Not on file  . Number of children: 1  . Years of education: Not on file  . Highest education level: Not on file  Occupational History  . Not on file  Social Needs  . Financial resource strain: Not on file  . Food insecurity:    Worry: Not on file    Inability: Not on file  . Transportation needs:    Medical: Not on file    Non-medical: Not on file  Tobacco Use  . Smoking status: Never Smoker  . Smokeless tobacco: Never Used  Substance and Sexual Activity  . Alcohol use: Yes    Alcohol/week: 0.0 standard drinks    Comment: glass of wine rarely  . Drug use: No  . Sexual activity: Yes    Partners: Male    Birth control/protection: Post-menopausal    Comment: Husband   Lifestyle  .  Physical activity:    Days per week: Not on file    Minutes per session: Not on file  . Stress: Not on file  Relationships  . Social connections:    Talks on phone: Not on file    Gets together: Not on file    Attends religious service: Not on file    Active member of club or organization: Not on file    Attends meetings of clubs or organizations: Not on file    Relationship status: Not on file  Other Topics Concern  . Not on file  Social History Narrative   Lives with husband    Daughter is grown and lives in [place]: None    Caffeine- No tea/coffee, dark chocolate only and occasionally    Right handed    Enjoys- HGTV    Outpatient Encounter Medications as of 01/05/2018  Medication Sig  . lisinopril-hydrochlorothiazide (PRINZIDE,ZESTORETIC) 10-12.5 MG tablet TAKE 1 TABLET BY MOUTH ONCE DAILY   No facility-administered encounter medications on file as of 01/05/2018.     Review of Systems  Constitutional: Negative for appetite change  and unexpected weight change.  HENT: Positive for congestion. Negative for sinus pressure and sore throat.        Some intermittent nasal congestion and left ear fullness.    Respiratory: Negative for cough, chest tightness and shortness of breath.   Cardiovascular: Negative for chest pain, palpitations and leg swelling.  Gastrointestinal: Negative for abdominal pain, diarrhea, nausea and vomiting.  Genitourinary: Negative for difficulty urinating and dysuria.  Musculoskeletal: Negative for joint swelling and myalgias.  Neurological: Negative for dizziness, light-headedness and headaches.  Psychiatric/Behavioral: Negative for agitation and dysphoric mood.       Objective:     Blood pressure rechecked by me:  138/82  Physical Exam  Constitutional: She appears well-developed and well-nourished. No distress.  HENT:  Nose: Nose normal.  Mouth/Throat: Oropharynx is clear and moist.  TMs with some cerumen but no impactions.  Right TM -  without erythema.    Neck: Neck supple.  Thyroid fullness.   Cardiovascular: Normal rate and regular rhythm.  Pulmonary/Chest: Breath sounds normal. No respiratory distress. She has no wheezes.  Abdominal: Soft. Bowel sounds are normal. There is no tenderness.  Musculoskeletal: She exhibits no edema or tenderness.  Lymphadenopathy:    She has no cervical adenopathy.  Skin: No rash noted. No erythema.  Psychiatric: She has a normal mood and affect. Her behavior is normal.    BP 138/82   Pulse 94   Temp 98.1 F (36.7 C) (Oral)   Resp 18   Wt 153 lb 12.8 oz (69.8 kg)   LMP 07/07/2008   SpO2 98%   BMI 24.45 kg/m  Wt Readings from Last 3 Encounters:  01/05/18 153 lb 12.8 oz (69.8 kg)  10/27/17 150 lb (68 kg)  08/17/14 156 lb (70.8 kg)     Lab Results  Component Value Date   WBC 3.4 (L) 06/08/2017   HGB 12.5 06/08/2017   HCT 36.8 06/08/2017   PLT 313.0 06/08/2017   GLUCOSE 99 06/08/2017   CHOL 189 06/08/2017   TRIG 131.0 06/08/2017   HDL 57.60 06/08/2017   LDLDIRECT 138.3 07/07/2012   LDLCALC 105 (H) 06/08/2017   ALT 17 06/08/2017   AST 19 06/08/2017   NA 139 06/08/2017   K 3.9 06/08/2017   CL 104 06/08/2017   CREATININE 0.92 06/08/2017   BUN 10 06/08/2017   CO2 28 06/08/2017   TSH 0.79 06/08/2017   HGBA1C 5.9 05/25/2017       Assessment & Plan:   Problem List Items Addressed This Visit    Cervical polyp    Saw gyn.  Had fragments removed.  Declined further w/up.        Essential hypertension, benign    Blood pressure under good control.  Continue same medication regimen.  Follow pressures.  Follow metabolic panel.        History of colonic polyps    Colonoscopy 09/2010 - rectal polyp (hyperplastic).  Overdue f/u colonoscopy.  Order placed for referral.      Relevant Orders   Ambulatory referral to Gastroenterology   Hypercholesterolemia    Low cholesterol diet and exercise.  Follow lipid panel.  Have discussed cholesterol medication.  Declines.          Stress    Discussed with her today.  Overall she feels she is doing relatively well.  Does not feel needs anything more at this time.  Follow.        Thyroid fullness    Discussed with her today.  Previous  biopsy.  Wanted to pursue f/u thyroid ultrasound.  She declines.  Will notify me when agreeable.            Einar Pheasant, MD

## 2018-01-10 NOTE — Assessment & Plan Note (Signed)
Saw gyn.  Had fragments removed.  Declined further w/up.

## 2018-01-10 NOTE — Assessment & Plan Note (Signed)
Low cholesterol diet and exercise.  Follow lipid panel.  Have discussed cholesterol medication.  Declines.

## 2018-01-10 NOTE — Assessment & Plan Note (Signed)
Discussed with her today.  Overall she feels she is doing relatively well.  Does not feel needs anything more at this time.  Follow.

## 2018-01-10 NOTE — Assessment & Plan Note (Signed)
Blood pressure under good control.  Continue same medication regimen.  Follow pressures.  Follow metabolic panel.   

## 2018-01-21 DIAGNOSIS — Z1211 Encounter for screening for malignant neoplasm of colon: Secondary | ICD-10-CM | POA: Diagnosis not present

## 2018-01-21 DIAGNOSIS — Z8371 Family history of colonic polyps: Secondary | ICD-10-CM | POA: Diagnosis not present

## 2018-04-01 ENCOUNTER — Encounter: Payer: Self-pay | Admitting: *Deleted

## 2018-04-02 ENCOUNTER — Ambulatory Visit
Admission: RE | Admit: 2018-04-02 | Discharge: 2018-04-02 | Disposition: A | Discharge status: Home / Self Care | Payer: BLUE CROSS/BLUE SHIELD | Attending: Unknown Physician Specialty | Admitting: Unknown Physician Specialty

## 2018-04-02 ENCOUNTER — Encounter
Admission: RE | Disposition: A | Discharge status: Home / Self Care | Payer: Self-pay | Source: Home / Self Care | Attending: Unknown Physician Specialty

## 2018-04-02 ENCOUNTER — Encounter: Payer: Self-pay | Admitting: Unknown Physician Specialty

## 2018-04-02 ENCOUNTER — Ambulatory Visit: Payer: BLUE CROSS/BLUE SHIELD | Admitting: Anesthesiology

## 2018-04-02 DIAGNOSIS — Z8371 Family history of colonic polyps: Secondary | ICD-10-CM | POA: Diagnosis not present

## 2018-04-02 DIAGNOSIS — K922 Gastrointestinal hemorrhage, unspecified: Secondary | ICD-10-CM | POA: Insufficient documentation

## 2018-04-02 DIAGNOSIS — Z86010 Personal history of colonic polyps: Secondary | ICD-10-CM | POA: Insufficient documentation

## 2018-04-02 DIAGNOSIS — Z1211 Encounter for screening for malignant neoplasm of colon: Secondary | ICD-10-CM | POA: Insufficient documentation

## 2018-04-02 DIAGNOSIS — I10 Essential (primary) hypertension: Secondary | ICD-10-CM | POA: Insufficient documentation

## 2018-04-02 DIAGNOSIS — K6389 Other specified diseases of intestine: Secondary | ICD-10-CM | POA: Diagnosis not present

## 2018-04-02 DIAGNOSIS — K640 First degree hemorrhoids: Secondary | ICD-10-CM | POA: Insufficient documentation

## 2018-04-02 HISTORY — DX: Palpitations: R00.2

## 2018-04-02 HISTORY — PX: COLONOSCOPY WITH PROPOFOL: SHX5780

## 2018-04-02 HISTORY — DX: Cardiac arrhythmia, unspecified: I49.9

## 2018-04-02 HISTORY — DX: Shortness of breath: R06.02

## 2018-04-02 LAB — HM COLONOSCOPY

## 2018-04-02 SURGERY — COLONOSCOPY WITH PROPOFOL
Anesthesia: General

## 2018-04-02 MED ORDER — SODIUM CHLORIDE 0.9 % IV SOLN
INTRAVENOUS | Status: DC
  Administered 2018-04-02: 11:00:00 via INTRAVENOUS

## 2018-04-02 MED ORDER — PROPOFOL 500 MG/50ML IV EMUL
INTRAVENOUS | Status: DC | PRN
  Administered 2018-04-02: 150 ug/kg/min via INTRAVENOUS

## 2018-04-02 MED ORDER — LIDOCAINE HCL (CARDIAC) PF 100 MG/5ML IV SOSY
PREFILLED_SYRINGE | INTRAVENOUS | Status: DC | PRN
  Administered 2018-04-02: 40 mg via INTRAVENOUS

## 2018-04-02 MED ORDER — PROPOFOL 10 MG/ML IV BOLUS
INTRAVENOUS | Status: AC
  Filled 2018-04-02: qty 20

## 2018-04-02 MED ORDER — PROPOFOL 10 MG/ML IV BOLUS
INTRAVENOUS | Status: DC | PRN
  Administered 2018-04-02: 50 mg via INTRAVENOUS

## 2018-04-02 NOTE — Anesthesia Postprocedure Evaluation (Signed)
Anesthesia Post Note  Patient: Cheryl Powers  Procedure(s) Performed: COLONOSCOPY WITH PROPOFOL (N/A )  Patient location during evaluation: Endoscopy Anesthesia Type: General Level of consciousness: awake and alert and oriented Pain management: pain level controlled Vital Signs Assessment: post-procedure vital signs reviewed and stable Respiratory status: spontaneous breathing, nonlabored ventilation and respiratory function stable Cardiovascular status: blood pressure returned to baseline and stable Postop Assessment: no signs of nausea or vomiting Anesthetic complications: no     Last Vitals:  Vitals:   04/02/18 1210 04/02/18 1220  BP: 118/67 116/70  Pulse:    Resp: 18 16  Temp:    SpO2: 100% 100%    Last Pain:  Vitals:   04/02/18 1220  TempSrc:   PainSc: 0-No pain                 [name]

## 2018-04-02 NOTE — Anesthesia Post-op Follow-up Note (Signed)
Anesthesia QCDR form completed.        

## 2018-04-02 NOTE — Anesthesia Preprocedure Evaluation (Signed)
Anesthesia Evaluation  Patient identified by MRN, date of birth, ID band Patient awake    Reviewed: Allergy & Precautions, NPO status , Patient's Chart, lab work & pertinent test results  History of Anesthesia Complications Negative for: history of anesthetic complications  Airway Mallampati: II  TM Distance: >3 FB Neck ROM: Full    Dental no notable dental hx.    Pulmonary neg pulmonary ROS, neg sleep apnea, neg COPD,    breath sounds clear to auscultation- rhonchi (-) wheezing      Cardiovascular Exercise Tolerance: Good hypertension, Pt. on medications (-) CAD, (-) Past MI, (-) Cardiac Stents and (-) CABG  Rhythm:Regular Rate:Normal - Systolic murmurs and - Diastolic murmurs    Neuro/Psych neg Seizures negative neurological ROS  negative psych ROS   GI/Hepatic negative GI ROS, Neg liver ROS,   Endo/Other  negative endocrine ROSneg diabetes  Renal/GU negative Renal ROS     Musculoskeletal negative musculoskeletal ROS (+)   Abdominal (+) - obese,   Peds  Hematology negative hematology ROS (+)   Anesthesia Other Findings Past Medical History: No date: Dysrhythmia     Comment:  PSVT No date: Hypercholesterolemia No date: Hypertension No date: Palpitations No date: SOBOE (shortness of breath on exertion)   Reproductive/Obstetrics                             Anesthesia Physical Anesthesia Plan  ASA: II  Anesthesia Plan: General   Post-op Pain Management:    Induction: Intravenous  PONV Risk Score and Plan: 2 and Propofol infusion  Airway Management Planned: Natural Airway  Additional Equipment:   Intra-op Plan:   Post-operative Plan:   Informed Consent: I have reviewed the patients History and Physical, chart, labs and discussed the procedure including the risks, benefits and alternatives for the proposed anesthesia with the patient or authorized representative who has  indicated his/her understanding and acceptance.   Dental advisory given  Plan Discussed with: CRNA and Anesthesiologist  Anesthesia Plan Comments:         Anesthesia Quick Evaluation

## 2018-04-02 NOTE — H&P (Signed)
Primary Care Physician:  Einar Pheasant, MD Primary Gastroenterologist:  Dr. Vira Agar  Pre-Procedure History & Physical: HPI:  Cheryl Powers is a 62 y.o. female is here for an colonoscopy.   Past Medical History:  Diagnosis Date  . Dysrhythmia    PSVT  . Hypercholesterolemia   . Hypertension   . Palpitations   . SOBOE (shortness of breath on exertion)     Past Surgical History:  Procedure Laterality Date  . CERVICAL POLYP REMOVAL  2003, 2009  . COLONOSCOPY  2012    Prior to Admission medications   Medication Sig Start Date End Date Taking? Authorizing Provider  lisinopril-hydrochlorothiazide (PRINZIDE,ZESTORETIC) 10-12.5 MG tablet TAKE 1 TABLET BY MOUTH ONCE DAILY 01/01/18  Yes Einar Pheasant, MD  mupirocin ointment (BACTROBAN) 2 % Place 1 application into the nose 2 (two) times daily.   Yes [provider]    Allergies as of 03/31/2018  . (No Known Allergies)    Family History  Problem Relation Age of Onset  . Hypertension Father   . Stroke Father 65  . Heart attack Father   . Diabetes Father        TYPE 2  . Hyperlipidemia Father   . Brain cancer Mother 81       Benign    Social History   Socioeconomic History  . Marital status: Married    Spouse name: Not on file  . Number of children: 1  . Years of education: Not on file  . Highest education level: Not on file  Occupational History  . Not on file  Social Needs  . Financial resource strain: Not on file  . Food insecurity:    Worry: Not on file    Inability: Not on file  . Transportation needs:    Medical: Not on file    Non-medical: Not on file  Tobacco Use  . Smoking status: Never Smoker  . Smokeless tobacco: Never Used  Substance and Sexual Activity  . Alcohol use: Yes    Alcohol/week: 0.0 standard drinks    Comment: glass of wine rarely  . Drug use: No  . Sexual activity: Yes    Partners: Male    Birth control/protection: Post-menopausal    Comment: Husband   Lifestyle   . Physical activity:    Days per week: Not on file    Minutes per session: Not on file  . Stress: Not on file  Relationships  . Social connections:    Talks on phone: Not on file    Gets together: Not on file    Attends religious service: Not on file    Active member of club or organization: Not on file    Attends meetings of clubs or organizations: Not on file    Relationship status: Not on file  . Intimate partner violence:    Fear of current or ex partner: Not on file    Emotionally abused: Not on file    Physically abused: Not on file    Forced sexual activity: Not on file  Other Topics Concern  . Not on file  Social History Narrative   Lives with husband    Daughter is grown and lives in [place]: None    Caffeine- No tea/coffee, dark chocolate only and occasionally    Right handed    Enjoys- HGTV    Review of Systems: See HPI, otherwise negative ROS  Physical Exam: BP 128/89   Pulse 93  Temp 98.3 F (36.8 C) (Tympanic)   Resp 18   Ht 5' 6.5" (1.689 m)   Wt 68 kg   LMP 07/07/2008   SpO2 100%   BMI 23.85 kg/m  General:   Alert,  pleasant and cooperative in NAD Head:  Normocephalic and atraumatic. Neck:  Supple; no masses or thyromegaly. Lungs:  Clear throughout to auscultation.    Heart:  Regular rate and rhythm. Abdomen:  Soft, nontender and nondistended. Normal bowel sounds, without guarding, and without rebound.   Neurologic:  Alert and  oriented x4;  grossly normal neurologically.  Impression/Plan: Cheryl Powers is here for an colonoscopy to be performed for FH colon polyps., last colon 5 years ago.  Risks, benefits, limitations, and alternatives regarding  colonoscopy have been reviewed with the patient.  Questions have been answered.  All parties agreeable.   Gaylyn Cheers, MD  04/02/2018, 11:27 AM

## 2018-04-02 NOTE — Op Note (Signed)
[facility] Gastroenterology Patient Name: Cheryl Powers Procedure Date: 04/02/2018 11:22 AM MRN: [id number] Account #: [id number] Date of Birth: 19-Jul-1955 Admit Type: Outpatient Age: 62 Room: [facility] ENDO ROOM 1 Gender: Female Note Status: Finalized Procedure:            Colonoscopy Indications:          Colon cancer screening in patient at increased risk:                        Family history of 1st-degree relative with colon polyps Providers:            Manya Silvas, MD Referring MD:         Einar Pheasant, MD (Referring MD) Medicines:            Propofol per Anesthesia Complications:        No immediate complications. Procedure:            Pre-Anesthesia Assessment:                       - After reviewing the risks and benefits, the patient                        was deemed in satisfactory condition to undergo the                        procedure.                       After obtaining informed consent, the colonoscope was                        passed under direct vision. Throughout the procedure,                        the patient's blood pressure, pulse, and oxygen                        saturations were monitored continuously. The                        Colonoscope was introduced through the anus and                        advanced to the the cecum, identified by appendiceal                        orifice and ileocecal valve. The colonoscopy was                        performed without difficulty. The patient tolerated the                        procedure well. The quality of the bowel preparation                        was excellent. Findings:      Patchy areas of minimal mildly erythematous mucosa was found scattered       in the sigmoid colon and in the descending colon. Biopsies were taken       with a cold forceps for histology.  Internal hemorrhoids were found during endoscopy. The hemorrhoids were       small and Grade I (internal  hemorrhoids that do not prolapse).      The exam was otherwise without abnormality. Impression:           - Erythematous mucosa in the sigmoid colon and in the                        descending colon. Biopsied.                       - Internal hemorrhoids.                       - The examination was otherwise normal. Recommendation:       - Await pathology results. Manya Silvas, MD 04/02/2018 11:51:20 AM This report has been signed electronically. Number of Addenda: 0 Note Initiated On: 04/02/2018 11:22 AM Scope Withdrawal Time: 0 hours 7 minutes 33 seconds  Total Procedure Duration: 0 hours 11 minutes 50 seconds       [facility]

## 2018-04-02 NOTE — Transfer of Care (Signed)
Immediate Anesthesia Transfer of Care Note  Patient: Cheryl Powers  Procedure(s) Performed: COLONOSCOPY WITH PROPOFOL (N/A )  Patient Location: PACU  Anesthesia Type:General  Level of Consciousness: awake, alert  and oriented  Airway & Oxygen Therapy: Patient Spontanous Breathing  Post-op Assessment: Report given to RN and Post -op Vital signs reviewed and stable  Post vital signs: Reviewed and stable  Last Vitals:  Vitals Value Taken Time  BP    Temp    Pulse    Resp    SpO2      Last Pain:  Vitals:   04/02/18 1045  TempSrc: Tympanic  PainSc: 0-No pain         Complications: No apparent anesthesia complications

## 2018-04-05 LAB — SURGICAL PATHOLOGY

## 2018-04-28 ENCOUNTER — Ambulatory Visit: Payer: BLUE CROSS/BLUE SHIELD | Admitting: Internal Medicine

## 2018-06-07 DIAGNOSIS — H5203 Hypermetropia, bilateral: Secondary | ICD-10-CM | POA: Diagnosis not present

## 2018-06-07 DIAGNOSIS — H52223 Regular astigmatism, bilateral: Secondary | ICD-10-CM | POA: Diagnosis not present

## 2018-06-07 DIAGNOSIS — H43813 Vitreous degeneration, bilateral: Secondary | ICD-10-CM | POA: Diagnosis not present

## 2018-06-07 DIAGNOSIS — H524 Presbyopia: Secondary | ICD-10-CM | POA: Diagnosis not present

## 2018-07-16 ENCOUNTER — Telehealth: Payer: Self-pay | Admitting: Internal Medicine

## 2018-07-16 NOTE — Telephone Encounter (Signed)
Copied from [place] [id number]. Topic: Quick Communication - See Telephone Encounter >> Jul 16, 2018  1:22 PM Blase Mess A wrote: CRM for notification. See Telephone encounter for: 07/16/18.  Patient is concerned about her 07/21/18 she is interested in a virtual appt. [phone]

## 2018-07-16 NOTE — Telephone Encounter (Signed)
LMTCB. OK to do webex visit.

## 2018-07-19 NOTE — Telephone Encounter (Signed)
Pt is going to do virtual visit. Will schedule on outlook. Explained directions to patient

## 2018-07-21 ENCOUNTER — Encounter

## 2018-07-21 ENCOUNTER — Encounter: Payer: Self-pay | Admitting: Internal Medicine

## 2018-07-21 ENCOUNTER — Ambulatory Visit (INDEPENDENT_AMBULATORY_CARE_PROVIDER_SITE_OTHER): Payer: BLUE CROSS/BLUE SHIELD | Admitting: Internal Medicine

## 2018-07-21 DIAGNOSIS — I10 Essential (primary) hypertension: Secondary | ICD-10-CM

## 2018-07-21 DIAGNOSIS — L299 Pruritus, unspecified: Secondary | ICD-10-CM

## 2018-07-21 DIAGNOSIS — E7800 Pure hypercholesterolemia, unspecified: Secondary | ICD-10-CM

## 2018-07-21 DIAGNOSIS — F439 Reaction to severe stress, unspecified: Secondary | ICD-10-CM | POA: Diagnosis not present

## 2018-07-21 DIAGNOSIS — Z86010 Personal history of colonic polyps: Secondary | ICD-10-CM | POA: Diagnosis not present

## 2018-07-21 NOTE — Assessment & Plan Note (Signed)
Overall doing better.  Discussed exercise - good release for her.  Follow.

## 2018-07-21 NOTE — Assessment & Plan Note (Signed)
Just had f/u colonoscopy 03/2018.  Recommended f/u in 5 years.

## 2018-07-21 NOTE — Assessment & Plan Note (Signed)
Blood pressure as outlined.  Off medication.  She desires not to take medication.  Discussed the need to follow her pressures.  If blood pressure elevated, will need medication.

## 2018-07-21 NOTE — Assessment & Plan Note (Signed)
Low cholesterol diet and exercise.  Have discussed cholesterol medication.  She declines.  Follow lipid panel.

## 2018-07-21 NOTE — Progress Notes (Signed)
Patient ID: Cheryl Powers, female   DOB: March 27, 1956, 63 y.o.   MRN: [id number] Virtual Visit via Video Note  I connected with Liane Comber on 07/21/18 at  8:00 AM EDT by a video enabled telemedicine application and verified that I am speaking with the correct person using two identifiers.  Location patient: her work office.   Location provider:work Persons participating in the virtual visit: patient, provider  I discussed the limitations of evaluation and management by telemedicine.  This visit type was conducted due to national recommendations for restrictions regarding the COVID-19 pandemic.  This format is felt to be most appropriate for this patient at this time.   The patient expressed understanding and agreed to proceed.   HPI: Being evaluated for a follow up appointment.  States she is doing well.  Work is going well.  She is enjoying her job.  Handling stress.  She stopped her blood pressure after her last visit.  States her blood pressure is doing well and she desires not to restart.  No headache.  No chest pain or tightness. No sob.  No acid reflux.  No abdominal pain.  Bowels moving.  Just had colonoscopy.  Recommended f/u in 5 years.  She does report she has noticed her skin itching. States is localized to her ankles, wrist and knees.  No rash.  Does not involve other parts her her body.  No new exposures or contacts.  Discussed need for f/u labs.  States she is trying to watch her diet.  Exercising.     ROS: See pertinent positives and negatives per HPI.  Past Medical History:  Diagnosis Date  . Dysrhythmia    PSVT  . Hypercholesterolemia   . Hypertension   . Palpitations   . SOBOE (shortness of breath on exertion)     Past Surgical History:  Procedure Laterality Date  . CERVICAL POLYP REMOVAL  2003, 2009  . COLONOSCOPY  2012  . COLONOSCOPY WITH PROPOFOL N/A 04/02/2018   Procedure: COLONOSCOPY WITH PROPOFOL;  Surgeon: Manya Silvas, MD;  [facility]: [facility] ENDOSCOPY;   Service: Endoscopy;  Laterality: N/A;    Family History  Problem Relation Age of Onset  . Hypertension Father   . Stroke Father 83  . Heart attack Father   . Diabetes Father        TYPE 2  . Hyperlipidemia Father   . Brain cancer Mother 42       Benign    SOCIAL HX: reviewed.    Current Outpatient Medications:  .  mupirocin ointment (BACTROBAN) 2 %, Place 1 application into the nose 2 (two) times daily., Disp: , Rfl:   EXAM:  VITALS:  States blood pressures averaging 128/86-90  GENERAL: alert, oriented, appears well and in no acute distress  HEENT: atraumatic, conjunttiva clear, no obvious abnormalities on inspection of external nose.   NECK: normal movements of the head and neck  LUNGS: on inspection no signs of respiratory distress, breathing rate appears normal, no obvious gross SOB, gasping or wheezing  CV: no obvious cyanosis  PSYCH/NEURO: pleasant and cooperative, no obvious depression or anxiety, speech and thought processing grossly intact  ASSESSMENT AND PLAN:  Discussed the following assessment and plan:  Essential hypertension, benign  History of colonic polyps  Hypercholesterolemia  Stress  Itching     I discussed the assessment and treatment plan with the patient. The patient was provided an opportunity to ask questions and all were answered. The patient agreed with the plan and  demonstrated an understanding of the instructions.   The patient was advised to call back or seek an in-person evaluation if the symptoms worsen or if the condition fails to improve as anticipated.  I provided 25 minutes of non-face-to-face time during this encounter.   Einar Pheasant, MD

## 2018-07-21 NOTE — Assessment & Plan Note (Signed)
Itching - localized as outlined.  No rash.  Discussed need for her to monitor for possible triggers.

## 2018-12-15 ENCOUNTER — Telehealth: Payer: Self-pay | Admitting: *Deleted

## 2018-12-15 DIAGNOSIS — I10 Essential (primary) hypertension: Secondary | ICD-10-CM

## 2018-12-15 DIAGNOSIS — E0789 Other specified disorders of thyroid: Secondary | ICD-10-CM

## 2018-12-15 DIAGNOSIS — E7800 Pure hypercholesterolemia, unspecified: Secondary | ICD-10-CM

## 2018-12-15 DIAGNOSIS — R739 Hyperglycemia, unspecified: Secondary | ICD-10-CM

## 2018-12-15 NOTE — Telephone Encounter (Signed)
Please place future orders for lab appt.  

## 2018-12-16 NOTE — Telephone Encounter (Signed)
Orders placed for f/u labs.  

## 2018-12-17 ENCOUNTER — Other Ambulatory Visit (INDEPENDENT_AMBULATORY_CARE_PROVIDER_SITE_OTHER): Payer: BC Managed Care – PPO

## 2018-12-17 ENCOUNTER — Other Ambulatory Visit: Payer: Self-pay

## 2018-12-17 DIAGNOSIS — R739 Hyperglycemia, unspecified: Secondary | ICD-10-CM

## 2018-12-17 DIAGNOSIS — E0789 Other specified disorders of thyroid: Secondary | ICD-10-CM | POA: Diagnosis not present

## 2018-12-17 DIAGNOSIS — I10 Essential (primary) hypertension: Secondary | ICD-10-CM

## 2018-12-17 DIAGNOSIS — E7800 Pure hypercholesterolemia, unspecified: Secondary | ICD-10-CM

## 2018-12-17 LAB — COMPREHENSIVE METABOLIC PANEL
ALT: 16 U/L (ref 0–35)
AST: 18 U/L (ref 0–37)
Albumin: 4.4 g/dL (ref 3.5–5.2)
Alkaline Phosphatase: 82 U/L (ref 39–117)
BUN: 13 mg/dL (ref 6–23)
CO2: 27 mEq/L (ref 19–32)
Calcium: 9.5 mg/dL (ref 8.4–10.5)
Chloride: 105 mEq/L (ref 96–112)
Creatinine, Ser: 0.95 mg/dL (ref 0.40–1.20)
GFR: 72.21 mL/min (ref >60.00)
Glucose, Bld: 99 mg/dL (ref 70–99)
Potassium: 3.4 mEq/L — ABNORMAL LOW (ref 3.5–5.1)
Sodium: 141 mEq/L (ref 135–145)
Total Bilirubin: 0.5 mg/dL (ref 0.2–1.2)
Total Protein: 7.4 g/dL (ref 6.0–8.3)

## 2018-12-17 LAB — HEMOGLOBIN A1C: Hgb A1c MFr Bld: 5.9 % (ref 4.6–6.5)

## 2018-12-17 LAB — CBC WITH DIFFERENTIAL/PLATELET
Basophils Absolute: 0 10*3/uL (ref 0.0–0.1)
Basophils Relative: 1 % (ref 0.0–3.0)
Eosinophils Absolute: 0.2 10*3/uL (ref 0.0–0.7)
Eosinophils Relative: 4.1 % (ref 0.0–5.0)
HCT: 38 % (ref 36.0–46.0)
Hemoglobin: 12.7 g/dL (ref 12.0–15.0)
Lymphocytes Relative: 40.6 % (ref 12.0–46.0)
Lymphs Abs: 1.5 10*3/uL (ref 0.7–4.0)
MCHC: 33.5 g/dL (ref 30.0–36.0)
MCV: 79.4 fl (ref 78.0–100.0)
Monocytes Absolute: 0.3 10*3/uL (ref 0.1–1.0)
Monocytes Relative: 8.9 % (ref 3.0–12.0)
Neutro Abs: 1.7 10*3/uL (ref 1.4–7.7)
Neutrophils Relative %: 45.4 % (ref 43.0–77.0)
Platelets: 289 10*3/uL (ref 150.0–400.0)
RBC: 4.79 Mil/uL (ref 3.87–5.11)
RDW: 13.1 % (ref 11.5–15.5)
WBC: 3.8 10*3/uL — ABNORMAL LOW (ref 4.0–10.5)

## 2018-12-17 LAB — LIPID PANEL
Cholesterol: 192 mg/dL (ref 0–200)
HDL: 55.2 mg/dL (ref >39.00)
LDL Cholesterol: 105 mg/dL — ABNORMAL HIGH (ref 0–99)
NonHDL: 136.79
Total CHOL/HDL Ratio: 3
Triglycerides: 160 mg/dL — ABNORMAL HIGH (ref 0.0–149.0)
VLDL: 32 mg/dL (ref 0.0–40.0)

## 2018-12-17 LAB — TSH: TSH: 1.8 u[IU]/mL (ref 0.35–4.50)

## 2018-12-22 ENCOUNTER — Ambulatory Visit (INDEPENDENT_AMBULATORY_CARE_PROVIDER_SITE_OTHER): Payer: BC Managed Care – PPO | Admitting: Internal Medicine

## 2018-12-22 ENCOUNTER — Encounter: Payer: Self-pay | Admitting: Internal Medicine

## 2018-12-22 ENCOUNTER — Other Ambulatory Visit: Payer: Self-pay

## 2018-12-22 VITALS — BP 135/80

## 2018-12-22 DIAGNOSIS — F439 Reaction to severe stress, unspecified: Secondary | ICD-10-CM | POA: Diagnosis not present

## 2018-12-22 DIAGNOSIS — E7800 Pure hypercholesterolemia, unspecified: Secondary | ICD-10-CM

## 2018-12-22 DIAGNOSIS — Z860100 Personal history of colon polyps, unspecified: Secondary | ICD-10-CM

## 2018-12-22 DIAGNOSIS — I10 Essential (primary) hypertension: Secondary | ICD-10-CM

## 2018-12-22 DIAGNOSIS — Z1239 Encounter for other screening for malignant neoplasm of breast: Secondary | ICD-10-CM

## 2018-12-22 DIAGNOSIS — E876 Hypokalemia: Secondary | ICD-10-CM

## 2018-12-22 NOTE — Progress Notes (Signed)
Patient ID: Cheryl Powers, female   DOB: Mar 22, 1956, 63 y.o.   MRN: [id number]   Virtual Visit via video Note  This visit type was conducted due to national recommendations for restrictions regarding the COVID-19 pandemic (e.g. social distancing).  This format is felt to be most appropriate for this patient at this time.  All issues noted in this document were discussed and addressed.  No physical exam was performed (except for noted visual exam findings with Video Visits).   I connected with Cheryl Powers by a video enabled telemedicine application and verified that I am speaking with the correct person using two identifiers. Location patient: home Location provider: work Persons participating in the virtual visit: patient, provider  I discussed the limitations, risks, security and privacy concerns of performing an evaluation and management service by video and the availability of in person appointments.  The patient expressed understanding and agreed to proceed.   Reason for visit: scheduled follow up.   HPI: States she is doing well.  Previously was started on blood pressure medication.  Prior to her last visit, she stopped it.  Desires not to take.  States blood pressure has been doing well.  On questioning, she states her pressure has been averaging 135-140/80s.  Trying to stay active.  No chest pain.  No sob. No acid reflux.  No abdominal pain.  Bowels moving.  Increased stress with caring for her mother.  Discussed with her today.  Overall she feels she is handling things relatively well.  Enjoying her job.  Sister tested positive for covid back in June.  She is having no symptoms.  No fever, cough, congestion or sob.  Discussed labs.  Discussed calculated cholesterol risk - 6.7%.  Discussed low cholesterol diet and exercise.     ROS: See pertinent positives and negatives per HPI.  Past Medical History:  Diagnosis Date  . Dysrhythmia    PSVT  . Hypercholesterolemia   .  Hypertension   . Palpitations   . SOBOE (shortness of breath on exertion)     Past Surgical History:  Procedure Laterality Date  . CERVICAL POLYP REMOVAL  2003, 2009  . COLONOSCOPY  2012  . COLONOSCOPY WITH PROPOFOL N/A 04/02/2018   Procedure: COLONOSCOPY WITH PROPOFOL;  Surgeon: Manya Silvas, MD;  Location: [facility] ENDOSCOPY;  Service: Endoscopy;  Laterality: N/A;    Family History  Problem Relation Age of Onset  . Hypertension Father   . Stroke Father 37  . Heart attack Father   . Diabetes Father        TYPE 2  . Hyperlipidemia Father   . Brain cancer Mother 78       Benign    SOCIAL HX: reviewed.    Current Outpatient Medications:  Marland Kitchen  Multiple Vitamin (MULTIVITAMIN) tablet, Take 1 tablet by mouth daily., Disp: , Rfl:   EXAM:  GENERAL: alert, oriented, appears well and in no acute distress  HEENT: atraumatic, conjunttiva clear, no obvious abnormalities on inspection of external nose and ears  NECK: normal movements of the head and neck  LUNGS: on inspection no signs of respiratory distress, breathing rate appears normal, no obvious gross SOB, gasping or wheezing  CV: no obvious cyanosis  PSYCH/NEURO: pleasant and cooperative, no obvious depression or anxiety, speech and thought processing grossly intact  ASSESSMENT AND PLAN:  Discussed the following assessment and plan:  Essential hypertension, benign Blood pressures as outlined.  She declines medication.  Follow pressures.  Follow metabolic panel.  History of colonic polyps Had f/u colonoscopy 03/2018.  Recommended f/u in 5 years.    Hypercholesterolemia Calculated cholesterol risk 6.7%.  She has declined cholesterol medication.  Low cholesterol diet and exercise.  Follow lipid panel.   Stress Increased stress as outlined.  Discussed with her today.  She feels she is handling things relatively well.  Does not feel needs any further intervention.  Follow.     I discussed the assessment and treatment  plan with the patient. The patient was provided an opportunity to ask questions and all were answered. The patient agreed with the plan and demonstrated an understanding of the instructions.   The patient was advised to call back or seek an in-person evaluation if the symptoms worsen or if the condition fails to improve as anticipated.   Einar Pheasant, MD

## 2018-12-27 ENCOUNTER — Encounter: Payer: Self-pay | Admitting: Internal Medicine

## 2018-12-27 NOTE — Assessment & Plan Note (Signed)
Had f/u colonoscopy 03/2018.  Recommended f/u in 5 years.

## 2018-12-27 NOTE — Assessment & Plan Note (Signed)
Calculated cholesterol risk 6.7%.  She has declined cholesterol medication.  Low cholesterol diet and exercise.  Follow lipid panel.

## 2018-12-27 NOTE — Assessment & Plan Note (Signed)
Increased stress as outlined.  Discussed with her today.  She feels she is handling things relatively well. Does not feel needs any further intervention.  Follow.   

## 2018-12-27 NOTE — Assessment & Plan Note (Signed)
Blood pressures as outlined.  She declines medication.  Follow pressures.  Follow metabolic panel.

## 2019-01-20 ENCOUNTER — Encounter: Payer: Self-pay | Admitting: Internal Medicine

## 2019-02-10 ENCOUNTER — Telehealth: Payer: Self-pay | Admitting: Internal Medicine

## 2019-02-10 NOTE — Telephone Encounter (Signed)
Blood pressures look good.

## 2019-02-10 NOTE — Telephone Encounter (Signed)
Patient aware.

## 2019-02-10 NOTE — Telephone Encounter (Signed)
BP are done in the mornings  9/17- 123/75 9/22- 121/70 9/29- 120/76 10/2- 119/75 10/9- 123/76 10/19- 120/71 10/21- 122/73  Mammogram is scheduled for 05/12/19

## 2019-05-12 ENCOUNTER — Ambulatory Visit
Admission: RE | Admit: 2019-05-12 | Discharge: 2019-05-12 | Disposition: A | Discharge status: Home / Self Care | Payer: BC Managed Care – PPO | Source: Ambulatory Visit | Attending: Internal Medicine | Admitting: Internal Medicine

## 2019-05-12 DIAGNOSIS — Z1239 Encounter for other screening for malignant neoplasm of breast: Secondary | ICD-10-CM

## 2019-05-12 DIAGNOSIS — Z1231 Encounter for screening mammogram for malignant neoplasm of breast: Secondary | ICD-10-CM | POA: Insufficient documentation

## 2019-07-12 ENCOUNTER — Telehealth: Payer: Self-pay | Admitting: Internal Medicine

## 2019-07-12 NOTE — Telephone Encounter (Signed)
Pt would like their results of her breast exam. Please call back.

## 2019-07-12 NOTE — Telephone Encounter (Signed)
Advised mammogram was normal.

## 2020-01-28 DIAGNOSIS — S60451A Superficial foreign body of left index finger, initial encounter: Secondary | ICD-10-CM | POA: Diagnosis not present

## 2020-03-07 ENCOUNTER — Ambulatory Visit: Payer: BC Managed Care – PPO | Admitting: Internal Medicine

## 2020-04-04 ENCOUNTER — Telehealth: Payer: BC Managed Care – PPO | Admitting: Internal Medicine

## 2020-08-07 ENCOUNTER — Encounter: Payer: BC Managed Care – PPO | Admitting: Internal Medicine

## 2020-09-03 ENCOUNTER — Other Ambulatory Visit (HOSPITAL_COMMUNITY)
Admission: RE | Admit: 2020-09-03 | Discharge: 2020-09-03 | Disposition: A | Discharge status: Home / Self Care | Payer: BC Managed Care – PPO | Source: Ambulatory Visit | Attending: Internal Medicine | Admitting: Internal Medicine

## 2020-09-03 ENCOUNTER — Other Ambulatory Visit: Payer: Self-pay

## 2020-09-03 ENCOUNTER — Ambulatory Visit (INDEPENDENT_AMBULATORY_CARE_PROVIDER_SITE_OTHER): Payer: BC Managed Care – PPO | Admitting: Internal Medicine

## 2020-09-03 VITALS — BP 122/80 | HR 99 | Temp 98.1°F | Ht 66.5 in | Wt 148.2 lb

## 2020-09-03 DIAGNOSIS — F439 Reaction to severe stress, unspecified: Secondary | ICD-10-CM

## 2020-09-03 DIAGNOSIS — H6121 Impacted cerumen, right ear: Secondary | ICD-10-CM

## 2020-09-03 DIAGNOSIS — Z1159 Encounter for screening for other viral diseases: Secondary | ICD-10-CM

## 2020-09-03 DIAGNOSIS — Z0000 Encounter for general adult medical examination without abnormal findings: Secondary | ICD-10-CM | POA: Diagnosis not present

## 2020-09-03 DIAGNOSIS — I10 Essential (primary) hypertension: Secondary | ICD-10-CM

## 2020-09-03 DIAGNOSIS — Z124 Encounter for screening for malignant neoplasm of cervix: Secondary | ICD-10-CM | POA: Insufficient documentation

## 2020-09-03 DIAGNOSIS — R739 Hyperglycemia, unspecified: Secondary | ICD-10-CM | POA: Insufficient documentation

## 2020-09-03 DIAGNOSIS — E0789 Other specified disorders of thyroid: Secondary | ICD-10-CM | POA: Diagnosis not present

## 2020-09-03 DIAGNOSIS — E7800 Pure hypercholesterolemia, unspecified: Secondary | ICD-10-CM

## 2020-09-03 DIAGNOSIS — Z114 Encounter for screening for human immunodeficiency virus [HIV]: Secondary | ICD-10-CM

## 2020-09-03 DIAGNOSIS — Z1231 Encounter for screening mammogram for malignant neoplasm of breast: Secondary | ICD-10-CM | POA: Diagnosis not present

## 2020-09-03 LAB — COMPREHENSIVE METABOLIC PANEL
ALT: 12 U/L (ref 0–35)
AST: 13 U/L (ref 0–37)
Albumin: 4.1 g/dL (ref 3.5–5.2)
Alkaline Phosphatase: 124 U/L — ABNORMAL HIGH (ref 39–117)
BUN: 13 mg/dL (ref 6–23)
CO2: 27 mEq/L (ref 19–32)
Calcium: 9.3 mg/dL (ref 8.4–10.5)
Chloride: 104 mEq/L (ref 96–112)
Creatinine, Ser: 0.84 mg/dL (ref 0.40–1.20)
GFR: 73.99 mL/min (ref >60.00)
Glucose, Bld: 100 mg/dL — ABNORMAL HIGH (ref 70–99)
Potassium: 4.1 mEq/L (ref 3.5–5.1)
Sodium: 140 mEq/L (ref 135–145)
Total Bilirubin: 0.4 mg/dL (ref 0.2–1.2)
Total Protein: 7.2 g/dL (ref 6.0–8.3)

## 2020-09-03 LAB — LIPID PANEL
Cholesterol: 154 mg/dL (ref 0–200)
HDL: 48.4 mg/dL (ref >39.00)
LDL Cholesterol: 87 mg/dL (ref 0–99)
NonHDL: 105.54
Total CHOL/HDL Ratio: 3
Triglycerides: 94 mg/dL (ref 0.0–149.0)
VLDL: 18.8 mg/dL (ref 0.0–40.0)

## 2020-09-03 LAB — CBC WITH DIFFERENTIAL/PLATELET
Basophils Absolute: 0 10*3/uL (ref 0.0–0.1)
Basophils Relative: 0.6 % (ref 0.0–3.0)
Eosinophils Absolute: 0.1 10*3/uL (ref 0.0–0.7)
Eosinophils Relative: 1.8 % (ref 0.0–5.0)
HCT: 34.6 % — ABNORMAL LOW (ref 36.0–46.0)
Hemoglobin: 11.5 g/dL — ABNORMAL LOW (ref 12.0–15.0)
Lymphocytes Relative: 24.9 % (ref 12.0–46.0)
Lymphs Abs: 1 10*3/uL (ref 0.7–4.0)
MCHC: 33.3 g/dL (ref 30.0–36.0)
MCV: 78.2 fl (ref 78.0–100.0)
Monocytes Absolute: 0.4 10*3/uL (ref 0.1–1.0)
Monocytes Relative: 8.9 % (ref 3.0–12.0)
Neutro Abs: 2.7 10*3/uL (ref 1.4–7.7)
Neutrophils Relative %: 63.8 % (ref 43.0–77.0)
Platelets: 425 10*3/uL — ABNORMAL HIGH (ref 150.0–400.0)
RBC: 4.42 Mil/uL (ref 3.87–5.11)
RDW: 12.5 % (ref 11.5–15.5)
WBC: 4.2 10*3/uL (ref 4.0–10.5)

## 2020-09-03 LAB — T4, FREE: Free T4: 1.47 ng/dL (ref 0.60–1.60)

## 2020-09-03 LAB — HEMOGLOBIN A1C: Hgb A1c MFr Bld: 6.4 % (ref 4.6–6.5)

## 2020-09-03 LAB — TSH: TSH: <0.01 u[IU]/mL — ABNORMAL LOW (ref 0.35–4.50)

## 2020-09-03 MED ORDER — DEBROX 6.5 % OT SOLN: OTIC | 0 refills | Status: DC

## 2020-09-03 NOTE — Assessment & Plan Note (Signed)
Physical today 09/03/20.  PAP 09/03/20.  Mammogram 05/12/19 - Birads I.  Due f/u mammogram.  Ordered.  Colonoscopy 03/2018 as outlined.  Recommended f/u in 5 years.

## 2020-09-03 NOTE — Assessment & Plan Note (Addendum)
Increased fullness thyroid.  Schedule f/u thyroid ultrasound.  Check tsh and free T4.

## 2020-09-03 NOTE — Progress Notes (Signed)
Patient ID: Cheryl Powers, female   DOB: 1956/02/25, 65 y.o.   MRN: [id number]   Subjective:    Patient ID: Cheryl Powers, female    DOB: [birth year], 65 y.o.   MRN: [id number]  HPI This visit occurred during the SARS-CoV-2 public health emergency.  Safety protocols were in place, including screening questions prior to the visit, additional usage of staff PPE, and extensive cleaning of exam room while observing appropriate contact time as indicated for disinfecting solutions.  Patient here for her physical exam. Increased stress.  Mother passed 03/01/20.  Overall she feels she is handling this relatively well.  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  Does not feel she needs any further intervention at this time.  Tries to stay active.  Has adjusted her diet.  Lost weight.  No chest pain or sob with increased activity or exertion.  No sob reported.  No increased cough or congestion.  No abdominal pain.  Bowels moving.     Past Medical History:  Diagnosis Date  . Dysrhythmia    PSVT  . Hypercholesterolemia   . Hypertension   . Palpitations   . SOBOE (shortness of breath on exertion)    Past Surgical History:  Procedure Laterality Date  . CERVICAL POLYP REMOVAL  2003, 2009  . COLONOSCOPY  2012  . COLONOSCOPY WITH PROPOFOL N/A 04/02/2018   Procedure: COLONOSCOPY WITH PROPOFOL;  Surgeon: Manya Silvas, MD;  Location: [facility] ENDOSCOPY;  Service: Endoscopy;  Laterality: N/A;   Family History  Problem Relation Age of Onset  . Hypertension Father   . Stroke Father 75  . Heart attack Father   . Diabetes Father        TYPE 2  . Hyperlipidemia Father   . Brain cancer Mother 61       Benign  . Breast cancer Neg Hx    Social History   Socioeconomic History  . Marital status: Married    Spouse name: Not on file  . Number of children: 1  . Years of education: Not on file  . Highest education level: Not on file  Occupational History  . Not on file  Tobacco Use  . Smoking  status: Never Smoker  . Smokeless tobacco: Never Used  Vaping Use  . Vaping Use: Never used  Substance and Sexual Activity  . Alcohol use: Yes    Alcohol/week: 0.0 standard drinks    Comment: glass of wine rarely  . Drug use: No  . Sexual activity: Yes    Partners: Male    Birth control/protection: Post-menopausal    Comment: Husband   Other Topics Concern  . Not on file  Social History Narrative   Lives with husband    Daughter is grown and lives in [place]: None    Caffeine- No tea/coffee, dark chocolate only and occasionally    Right handed    Enjoys- [employer]   Social Determinants of Health   Financial Resource Strain: Not on file  Food Insecurity: Not on file  Transportation Needs: Not on file  Physical Activity: Not on file  Stress: Not on file  Social Connections: Not on file    Outpatient Encounter Medications as of 09/03/2020  Medication Sig  . carbamide peroxide (DEBROX) 6.5 % OTIC solution 4-5 drops right ear q day.  Massage for approximately 5 minutes.  . Multiple Vitamin (MULTIVITAMIN) tablet Take 1 tablet by mouth daily.   No facility-administered encounter medications on file as  of 09/03/2020.   Review of Systems  Constitutional: Negative for appetite change and unexpected weight change.  HENT: Negative for congestion, sinus pressure and sore throat.   Eyes: Negative for pain and visual disturbance.  Respiratory: Negative for cough, chest tightness and shortness of breath.   Cardiovascular: Negative for chest pain, palpitations and leg swelling.  Gastrointestinal: Negative for abdominal pain, diarrhea, nausea and vomiting.  Genitourinary: Negative for difficulty urinating and dysuria.  Musculoskeletal: Negative for joint swelling and myalgias.  Skin: Negative for color change and rash.  Neurological: Negative for dizziness, light-headedness and headaches.  Hematological: Negative for adenopathy. Does not bruise/bleed easily.  Psychiatric/Behavioral:  Negative for agitation and dysphoric mood.       Increased stress as outlined.        Objective:    Physical Exam Vitals reviewed.  Constitutional:      General: She is not in acute distress.    Appearance: Normal appearance.  HENT:     Head: Normocephalic and atraumatic.     Right Ear: External ear normal. There is impacted cerumen.     Left Ear: External ear normal. There is no impacted cerumen.  Eyes:     General: No scleral icterus.       Right eye: No discharge.        Left eye: No discharge.     Conjunctiva/sclera: Conjunctivae normal.  Neck:     Comments: Thyroid fullness Cardiovascular:     Rate and Rhythm: Normal rate and regular rhythm.  Pulmonary:     Effort: Pulmonary effort is normal.     Breath sounds: Normal breath sounds. No wheezing.     Comments: Breasts: no nipple discharge or nipple retraction present. Could not appreciate any nodules or axillary adenopathy present.   Abdominal:     General: Bowel sounds are normal.     Palpations: Abdomen is soft.     Tenderness: There is no abdominal tenderness.  Genitourinary:    Comments: Normal external genitalia.  Vaginal vault without lesions.  Cervix identified.  Pap smear performed.  Could not appreciate any adnexal masses or tenderness.   Musculoskeletal:        General: No swelling or tenderness.     Cervical back: Neck supple. No tenderness.  Lymphadenopathy:     Cervical: No cervical adenopathy.  Skin:    Findings: No erythema or rash.  Neurological:     Mental Status: She is alert.  Psychiatric:        Mood and Affect: Mood normal.        Behavior: Behavior normal.     BP 122/80 (BP Location: Left Arm, Patient Position: Sitting, Cuff Size: Normal)   Pulse 99   Temp 98.1 F (36.7 C) (Oral)   Ht 5' 6.5" (1.689 m)   Wt 148 lb 3.2 oz (67.2 kg)   LMP 07/07/2008 (Approximate)   SpO2 99%   BMI 23.56 kg/m  Wt Readings from Last 3 Encounters:  09/03/20 148 lb 3.2 oz (67.2 kg)  04/02/18 150 lb (68  kg)  01/05/18 153 lb 12.8 oz (69.8 kg)     Lab Results  Component Value Date   WBC 4.2 09/03/2020   HGB 11.5 (L) 09/03/2020   HCT 34.6 (L) 09/03/2020   PLT 425.0 (H) 09/03/2020   GLUCOSE 100 (H) 09/03/2020   CHOL 154 09/03/2020   TRIG 94.0 09/03/2020   HDL 48.40 09/03/2020   LDLDIRECT 138.3 07/07/2012   LDLCALC 87 09/03/2020   ALT 12  09/03/2020   AST 13 09/03/2020   NA 140 09/03/2020   K 4.1 09/03/2020   CL 104 09/03/2020   CREATININE 0.84 09/03/2020   BUN 13 09/03/2020   CO2 27 09/03/2020   TSH <0.01 (L) 09/03/2020   HGBA1C 6.4 09/03/2020    MM 3D SCREEN BREAST BILATERAL  Result Date: 05/12/2019 CLINICAL DATA:  Screening. EXAM: DIGITAL SCREENING BILATERAL MAMMOGRAM WITH TOMO AND CAD COMPARISON:  Previous exam(s). ACR Breast Density Category b: There are scattered areas of fibroglandular density. FINDINGS: There are no findings suspicious for malignancy. Images were processed with CAD. IMPRESSION: No mammographic evidence of malignancy. A result letter of this screening mammogram will be mailed directly to the patient. RECOMMENDATION: Screening mammogram in one year. (Code:SM-B-01Y) BI-RADS CATEGORY  1: Negative. Electronically Signed   By: Claudie Revering M.D.   On: 05/12/2019 16:43       Assessment & Plan:   Problem List Items Addressed This Visit    Cerumen impaction    Cerumen impaction right ear.  Debrox as instructed.  Will notify if desires to return for ear irrigation. She wants to hold at this time.       Essential hypertension, benign    Blood pressure as outlined.  Improved.  On no medication.  Follow pressure.  Follow metabolic panel.       Healthcare maintenance    Physical today 09/03/20.  PAP 09/03/20.  Mammogram 05/12/19 - Birads I.  Due f/u mammogram.  Ordered.  Colonoscopy 03/2018 as outlined.  Recommended f/u in 5 years.        Hypercholesterolemia    She has previously wanted to hold on starting a statin medication.  Low cholesterol diet and exercise.   Follow lipid panel.       Relevant Orders   Comprehensive metabolic panel (Completed)   CBC with Differential/Platelet (Completed)   Lipid panel (Completed)   Hyperglycemia    Low carb diet and exercise. Follow met b and a1c.       Relevant Orders   Hemoglobin A1c (Completed)   Stress    Increased stress as outlined.  Discussed.  Overall she feels she is handling things relatively well.  Does not feel needs any further intervention.  Follow.       Thyroid fullness    Increased fullness thyroid.  Schedule f/u thyroid ultrasound.  Check tsh and free T4.       Relevant Orders   TSH (Completed)   T4, free (Completed)    Other Visit Diagnoses    Routine general medical examination at a health care facility    -  Primary   Encounter for screening mammogram for malignant neoplasm of breast       Relevant Orders   MM 3D SCREEN BREAST BILATERAL   Need for hepatitis C screening test       Relevant Orders   Hepatitis C antibody (Completed)   Screening for HIV without presence of risk factors       Relevant Orders   HIV antibody (with reflex) (Completed)   Cervical cancer screening       Relevant Orders   Cytology - PAP( [facility]) (Completed)       Einar Pheasant, MD

## 2020-09-04 ENCOUNTER — Other Ambulatory Visit: Payer: Self-pay | Admitting: Internal Medicine

## 2020-09-04 ENCOUNTER — Other Ambulatory Visit (INDEPENDENT_AMBULATORY_CARE_PROVIDER_SITE_OTHER): Payer: BC Managed Care – PPO

## 2020-09-04 DIAGNOSIS — R7989 Other specified abnormal findings of blood chemistry: Secondary | ICD-10-CM

## 2020-09-04 LAB — HEPATITIS C ANTIBODY
Hepatitis C Ab: NONREACTIVE
SIGNAL TO CUT-OFF: 0.34 (ref <1.00)

## 2020-09-04 LAB — T3, FREE: T3, Free: 3.8 pg/mL (ref 2.3–4.2)

## 2020-09-04 LAB — HIV ANTIBODY (ROUTINE TESTING W REFLEX): HIV 1&2 Ab, 4th Generation: NONREACTIVE

## 2020-09-04 NOTE — Progress Notes (Signed)
Order placed for add on Free T3

## 2020-09-05 LAB — CYTOLOGY - PAP
Comment: NEGATIVE
Diagnosis: NEGATIVE
High risk HPV: NEGATIVE

## 2020-09-09 ENCOUNTER — Encounter: Payer: Self-pay | Admitting: Internal Medicine

## 2020-09-09 DIAGNOSIS — H6120 Impacted cerumen, unspecified ear: Secondary | ICD-10-CM | POA: Insufficient documentation

## 2020-09-09 NOTE — Assessment & Plan Note (Signed)
She has previously wanted to hold on starting a statin medication.  Low cholesterol diet and exercise.  Follow lipid panel.

## 2020-09-09 NOTE — Assessment & Plan Note (Signed)
Low carb diet and exercise. Follow met b and a1c.  

## 2020-09-09 NOTE — Assessment & Plan Note (Signed)
Cerumen impaction right ear.  Debrox as instructed.  Will notify if desires to return for ear irrigation. She wants to hold at this time.

## 2020-09-09 NOTE — Assessment & Plan Note (Signed)
Increased stress as outlined.  Discussed.  Overall she feels she is handling things relatively well.  Does not feel needs any further intervention.  Follow.

## 2020-09-09 NOTE — Assessment & Plan Note (Signed)
Blood pressure as outlined.  Improved.  On no medication.  Follow pressure.  Follow metabolic panel.

## 2020-09-18 ENCOUNTER — Ambulatory Visit
Admission: RE | Admit: 2020-09-18 | Discharge: 2020-09-18 | Disposition: A | Discharge status: Home / Self Care | Payer: BC Managed Care – PPO | Source: Ambulatory Visit | Attending: Internal Medicine | Admitting: Internal Medicine

## 2020-09-18 ENCOUNTER — Other Ambulatory Visit: Payer: Self-pay

## 2020-09-18 DIAGNOSIS — Z1231 Encounter for screening mammogram for malignant neoplasm of breast: Secondary | ICD-10-CM | POA: Insufficient documentation

## 2020-09-21 ENCOUNTER — Other Ambulatory Visit: Payer: Self-pay | Admitting: Internal Medicine

## 2020-09-21 DIAGNOSIS — D649 Anemia, unspecified: Secondary | ICD-10-CM

## 2020-09-21 DIAGNOSIS — R7989 Other specified abnormal findings of blood chemistry: Secondary | ICD-10-CM

## 2020-09-21 DIAGNOSIS — E0789 Other specified disorders of thyroid: Secondary | ICD-10-CM

## 2020-09-21 DIAGNOSIS — R748 Abnormal levels of other serum enzymes: Secondary | ICD-10-CM

## 2020-09-21 NOTE — Progress Notes (Signed)
Order placed for f/u labs and thyroid ultrasound.

## 2020-09-25 ENCOUNTER — Other Ambulatory Visit: Payer: BC Managed Care – PPO

## 2020-09-27 ENCOUNTER — Other Ambulatory Visit: Payer: BC Managed Care – PPO

## 2020-09-28 ENCOUNTER — Telehealth: Payer: Self-pay

## 2020-09-28 ENCOUNTER — Other Ambulatory Visit: Payer: BC Managed Care – PPO

## 2020-09-28 NOTE — Telephone Encounter (Signed)
Per note will call back to reschedule labs.

## 2020-09-28 NOTE — Telephone Encounter (Signed)
Providing access nurse documentation.      

## 2020-09-28 NOTE — Telephone Encounter (Signed)
Pt called in at 7:59 am to cancel lab appointments. Providing Access Nurse schedule.

## 2020-10-09 ENCOUNTER — Other Ambulatory Visit: Payer: BC Managed Care – PPO

## 2020-10-10 ENCOUNTER — Other Ambulatory Visit (INDEPENDENT_AMBULATORY_CARE_PROVIDER_SITE_OTHER): Payer: BC Managed Care – PPO

## 2020-10-10 ENCOUNTER — Other Ambulatory Visit: Payer: Self-pay

## 2020-10-10 DIAGNOSIS — R7989 Other specified abnormal findings of blood chemistry: Secondary | ICD-10-CM | POA: Diagnosis not present

## 2020-10-10 DIAGNOSIS — D649 Anemia, unspecified: Secondary | ICD-10-CM | POA: Diagnosis not present

## 2020-10-10 DIAGNOSIS — R748 Abnormal levels of other serum enzymes: Secondary | ICD-10-CM

## 2020-10-10 LAB — HEPATIC FUNCTION PANEL
ALT: 21 U/L (ref 0–35)
AST: 20 U/L (ref 0–37)
Albumin: 4.7 g/dL (ref 3.5–5.2)
Alkaline Phosphatase: 92 U/L (ref 39–117)
Bilirubin, Direct: 0.1 mg/dL (ref 0.0–0.3)
Total Bilirubin: 0.4 mg/dL (ref 0.2–1.2)
Total Protein: 7.8 g/dL (ref 6.0–8.3)

## 2020-10-10 LAB — CBC WITH DIFFERENTIAL/PLATELET
Basophils Absolute: 0 10*3/uL (ref 0.0–0.1)
Basophils Relative: 0.5 % (ref 0.0–3.0)
Eosinophils Absolute: 0.1 10*3/uL (ref 0.0–0.7)
Eosinophils Relative: 3.4 % (ref 0.0–5.0)
HCT: 38.5 % (ref 36.0–46.0)
Hemoglobin: 12.9 g/dL (ref 12.0–15.0)
Lymphocytes Relative: 43 % (ref 12.0–46.0)
Lymphs Abs: 1.3 10*3/uL (ref 0.7–4.0)
MCHC: 33.5 g/dL (ref 30.0–36.0)
MCV: 77.7 fl — ABNORMAL LOW (ref 78.0–100.0)
Monocytes Absolute: 0.2 10*3/uL (ref 0.1–1.0)
Monocytes Relative: 7 % (ref 3.0–12.0)
Neutro Abs: 1.4 10*3/uL (ref 1.4–7.7)
Neutrophils Relative %: 46.1 % (ref 43.0–77.0)
Platelets: 278 10*3/uL (ref 150.0–400.0)
RBC: 4.95 Mil/uL (ref 3.87–5.11)
RDW: 14.4 % (ref 11.5–15.5)
WBC: 3.1 10*3/uL — ABNORMAL LOW (ref 4.0–10.5)

## 2020-10-10 LAB — T3, FREE: T3, Free: 3.3 pg/mL (ref 2.3–4.2)

## 2020-10-10 LAB — VITAMIN B12: Vitamin B-12: 152 pg/mL — ABNORMAL LOW (ref 211–911)

## 2020-10-10 LAB — IBC + FERRITIN
Ferritin: 174.6 ng/mL (ref 10.0–291.0)
Iron: 85 ug/dL (ref 42–145)
Saturation Ratios: 20.4 % (ref 20.0–50.0)
Transferrin: 297 mg/dL (ref 212.0–360.0)

## 2020-10-10 LAB — T4, FREE: Free T4: 0.59 ng/dL — ABNORMAL LOW (ref 0.60–1.60)

## 2020-10-10 LAB — TSH: TSH: 4.28 u[IU]/mL (ref 0.35–4.50)

## 2020-10-11 ENCOUNTER — Other Ambulatory Visit: Payer: Self-pay | Admitting: Internal Medicine

## 2020-10-11 DIAGNOSIS — D72829 Elevated white blood cell count, unspecified: Secondary | ICD-10-CM

## 2020-10-11 NOTE — Progress Notes (Signed)
Order placed for f/u cbc.   

## 2020-10-12 ENCOUNTER — Ambulatory Visit: Payer: BC Managed Care – PPO

## 2020-10-13 ENCOUNTER — Other Ambulatory Visit: Payer: Self-pay | Admitting: Internal Medicine

## 2020-10-13 DIAGNOSIS — E538 Deficiency of other specified B group vitamins: Secondary | ICD-10-CM

## 2020-10-13 NOTE — Progress Notes (Unsigned)
Orders placed for f/u labs.  

## 2020-10-18 ENCOUNTER — Ambulatory Visit: Payer: BC Managed Care – PPO

## 2020-10-24 ENCOUNTER — Ambulatory Visit: Payer: BC Managed Care – PPO

## 2020-10-25 ENCOUNTER — Ambulatory Visit: Payer: BC Managed Care – PPO

## 2020-11-01 ENCOUNTER — Ambulatory Visit: Payer: BC Managed Care – PPO

## 2020-11-06 ENCOUNTER — Other Ambulatory Visit: Payer: Self-pay

## 2020-11-06 ENCOUNTER — Ambulatory Visit
Admission: RE | Admit: 2020-11-06 | Discharge: 2020-11-06 | Disposition: A | Discharge status: Home / Self Care | Payer: BC Managed Care – PPO | Source: Ambulatory Visit | Attending: Internal Medicine | Admitting: Internal Medicine

## 2020-11-06 DIAGNOSIS — E0789 Other specified disorders of thyroid: Secondary | ICD-10-CM | POA: Diagnosis not present

## 2020-11-06 DIAGNOSIS — E042 Nontoxic multinodular goiter: Secondary | ICD-10-CM | POA: Diagnosis not present

## 2020-11-28 DIAGNOSIS — H5203 Hypermetropia, bilateral: Secondary | ICD-10-CM | POA: Diagnosis not present

## 2020-11-28 DIAGNOSIS — H43813 Vitreous degeneration, bilateral: Secondary | ICD-10-CM | POA: Diagnosis not present

## 2020-11-28 DIAGNOSIS — H524 Presbyopia: Secondary | ICD-10-CM | POA: Diagnosis not present

## 2020-11-28 DIAGNOSIS — H52223 Regular astigmatism, bilateral: Secondary | ICD-10-CM | POA: Diagnosis not present

## 2021-01-04 ENCOUNTER — Encounter: Payer: Self-pay | Admitting: Internal Medicine

## 2021-01-04 ENCOUNTER — Other Ambulatory Visit: Payer: Self-pay

## 2021-01-04 ENCOUNTER — Ambulatory Visit: Payer: BC Managed Care – PPO | Admitting: Internal Medicine

## 2021-01-04 VITALS — BP 148/86 | HR 89 | Temp 98.3°F | Resp 16 | Ht 67.0 in | Wt 153.2 lb

## 2021-01-04 DIAGNOSIS — E7800 Pure hypercholesterolemia, unspecified: Secondary | ICD-10-CM | POA: Diagnosis not present

## 2021-01-04 DIAGNOSIS — R739 Hyperglycemia, unspecified: Secondary | ICD-10-CM | POA: Diagnosis not present

## 2021-01-04 DIAGNOSIS — E0789 Other specified disorders of thyroid: Secondary | ICD-10-CM | POA: Diagnosis not present

## 2021-01-04 DIAGNOSIS — Z86010 Personal history of colonic polyps: Secondary | ICD-10-CM

## 2021-01-04 DIAGNOSIS — I10 Essential (primary) hypertension: Secondary | ICD-10-CM | POA: Diagnosis not present

## 2021-01-04 DIAGNOSIS — F439 Reaction to severe stress, unspecified: Secondary | ICD-10-CM

## 2021-01-04 DIAGNOSIS — E538 Deficiency of other specified B group vitamins: Secondary | ICD-10-CM | POA: Diagnosis not present

## 2021-01-04 LAB — HEPATIC FUNCTION PANEL
ALT: 16 U/L (ref 0–35)
AST: 16 U/L (ref 0–37)
Albumin: 4.4 g/dL (ref 3.5–5.2)
Alkaline Phosphatase: 81 U/L (ref 39–117)
Bilirubin, Direct: 0.1 mg/dL (ref 0.0–0.3)
Total Bilirubin: 0.5 mg/dL (ref 0.2–1.2)
Total Protein: 7.4 g/dL (ref 6.0–8.3)

## 2021-01-04 LAB — CBC WITH DIFFERENTIAL/PLATELET
Basophils Absolute: 0 10*3/uL (ref 0.0–0.1)
Basophils Relative: 1.2 % (ref 0.0–3.0)
Eosinophils Absolute: 0.1 10*3/uL (ref 0.0–0.7)
Eosinophils Relative: 3.6 % (ref 0.0–5.0)
HCT: 40.4 % (ref 36.0–46.0)
Hemoglobin: 13.3 g/dL (ref 12.0–15.0)
Lymphocytes Relative: 40.5 % (ref 12.0–46.0)
Lymphs Abs: 1.3 10*3/uL (ref 0.7–4.0)
MCHC: 33 g/dL (ref 30.0–36.0)
MCV: 79.3 fl (ref 78.0–100.0)
Monocytes Absolute: 0.2 10*3/uL (ref 0.1–1.0)
Monocytes Relative: 6.4 % (ref 3.0–12.0)
Neutro Abs: 1.5 10*3/uL (ref 1.4–7.7)
Neutrophils Relative %: 48.3 % (ref 43.0–77.0)
Platelets: 290 10*3/uL (ref 150.0–400.0)
RBC: 5.1 Mil/uL (ref 3.87–5.11)
RDW: 14.8 % (ref 11.5–15.5)
WBC: 3.1 10*3/uL — ABNORMAL LOW (ref 4.0–10.5)

## 2021-01-04 LAB — BASIC METABOLIC PANEL
BUN: 15 mg/dL (ref 6–23)
CO2: 28 mEq/L (ref 19–32)
Calcium: 9.5 mg/dL (ref 8.4–10.5)
Chloride: 104 mEq/L (ref 96–112)
Creatinine, Ser: 0.98 mg/dL (ref 0.40–1.20)
GFR: 61.35 mL/min (ref >60.00)
Glucose, Bld: 105 mg/dL — ABNORMAL HIGH (ref 70–99)
Potassium: 3.6 mEq/L (ref 3.5–5.1)
Sodium: 141 mEq/L (ref 135–145)

## 2021-01-04 LAB — VITAMIN B12: Vitamin B-12: 726 pg/mL (ref 211–911)

## 2021-01-04 LAB — LIPID PANEL
Cholesterol: 250 mg/dL — ABNORMAL HIGH (ref 0–200)
HDL: 67.5 mg/dL (ref >39.00)
LDL Cholesterol: 161 mg/dL — ABNORMAL HIGH (ref 0–99)
NonHDL: 182.92
Total CHOL/HDL Ratio: 4
Triglycerides: 109 mg/dL (ref 0.0–149.0)
VLDL: 21.8 mg/dL (ref 0.0–40.0)

## 2021-01-04 LAB — HEMOGLOBIN A1C: Hgb A1c MFr Bld: 6.2 % (ref 4.6–6.5)

## 2021-01-04 LAB — TSH: TSH: 2.59 u[IU]/mL (ref 0.35–5.50)

## 2021-01-04 NOTE — Progress Notes (Signed)
Patient ID: Cheryl Powers, female   DOB: 1956/02/25, 65 y.o.   MRN: [id number]   Subjective:    Patient ID: Cheryl Powers, female    DOB: 10-29-1955, 65 y.o.   MRN: [id number]  This visit occurred during the SARS-CoV-2 public health emergency.  Safety protocols were in place, including screening questions prior to the visit, additional usage of staff PPE, and extensive cleaning of exam room while observing appropriate contact time as indicated for disinfecting solutions.   Patient here for a scheduled follow up.  Marland Kitchen   HPI Here to follow up regarding increased stress.  Also f/u regarding her blood pressure and thyroid.  Increased stress recently.  Increased stress with work, Social research officer, government.  Discussed.  Does not feel needs any further intervention.  Discussed diet and exercise.  No chest pain or sob reported.  No cough or congestion.  No abdominal pain or bowel change reported.  Taking liquid b12.  Desires not to take cholesterol medication.  Blood pressure elevated.     Past Medical History:  Diagnosis Date   Dysrhythmia    PSVT   Hypercholesterolemia    Hypertension    Palpitations    SOBOE (shortness of breath on exertion)    Past Surgical History:  Procedure Laterality Date   CERVICAL POLYP REMOVAL  2003, 2009   COLONOSCOPY  2012   COLONOSCOPY WITH PROPOFOL N/A 04/02/2018   Procedure: COLONOSCOPY WITH PROPOFOL;  Surgeon: Manya Silvas, MD;  Location: [facility] ENDOSCOPY;  Service: Endoscopy;  Laterality: N/A;   Family History  Problem Relation Age of Onset   Hypertension Father    Stroke Father 2   Heart attack Father    Diabetes Father        TYPE 2   Hyperlipidemia Father    Brain cancer Mother 24       Benign   Breast cancer Neg Hx    Social History   Socioeconomic History   Marital status: Married    Spouse name: Not on file   Number of children: 1   Years of education: Not on file   Highest education level: Not on file  Occupational History   Not on file  Tobacco Use    Smoking status: Never   Smokeless tobacco: Never  Vaping Use   Vaping Use: Never used  Substance and Sexual Activity   Alcohol use: Yes    Alcohol/week: 0.0 standard drinks    Comment: glass of wine rarely   Drug use: No   Sexual activity: Yes    Partners: Male    Birth control/protection: Post-menopausal    Comment: Husband   Other Topics Concern   Not on file  Social History Narrative   Lives with husband    Daughter is grown and lives in [place]    Pets: None    Caffeine- No tea/coffee, dark chocolate only and occasionally    Right handed    Enjoys- HGTV   Social Determinants of Health   Financial Resource Strain: Not on file  Food Insecurity: Not on file  Transportation Needs: Not on file  Physical Activity: Not on file  Stress: Not on file  Social Connections: Not on file    Review of Systems  Constitutional:  Negative for appetite change and unexpected weight change.  HENT:  Negative for congestion and sinus pressure.   Respiratory:  Negative for cough, chest tightness and shortness of breath.   Cardiovascular:  Negative for chest pain, palpitations and leg swelling.  Gastrointestinal:  Negative for abdominal pain, diarrhea, nausea and vomiting.  Genitourinary:  Negative for difficulty urinating and dysuria.  Musculoskeletal:  Negative for joint swelling and myalgias.  Skin:  Negative for color change and rash.  Neurological:  Negative for dizziness, light-headedness and headaches.  Psychiatric/Behavioral:  Negative for agitation and dysphoric mood.        Increased stress.       Objective:     BP (!) 148/86   Pulse 89   Temp 98.3 F (36.8 C) (Oral)   Resp 16   Ht $R'5\' 7"'VI$  (1.702 m)   Wt 153 lb 4 oz (69.5 kg)   LMP 07/07/2008 (Approximate)   SpO2 97%   BMI 24.00 kg/m  Wt Readings from Last 3 Encounters:  01/04/21 153 lb 4 oz (69.5 kg)  09/03/20 148 lb 3.2 oz (67.2 kg)  04/02/18 150 lb (68 kg)    Physical Exam Vitals reviewed.  Constitutional:       General: She is not in acute distress.    Appearance: Normal appearance.  HENT:     Head: Normocephalic and atraumatic.     Right Ear: External ear normal.     Left Ear: External ear normal.  Eyes:     General: No scleral icterus.       Right eye: No discharge.        Left eye: No discharge.     Conjunctiva/sclera: Conjunctivae normal.  Neck:     Thyroid: No thyromegaly.  Cardiovascular:     Rate and Rhythm: Normal rate and regular rhythm.  Pulmonary:     Effort: No respiratory distress.     Breath sounds: Normal breath sounds. No wheezing.  Abdominal:     General: Bowel sounds are normal.     Palpations: Abdomen is soft.     Tenderness: There is no abdominal tenderness.  Musculoskeletal:        General: No swelling or tenderness.     Cervical back: Neck supple. No tenderness.  Lymphadenopathy:     Cervical: No cervical adenopathy.  Skin:    Findings: No erythema or rash.  Neurological:     Mental Status: She is alert.  Psychiatric:        Mood and Affect: Mood normal.        Behavior: Behavior normal.     Outpatient Encounter Medications as of 01/04/2021  Medication Sig   carbamide peroxide (DEBROX) 6.5 % OTIC solution 4-5 drops right ear q day.  Massage for approximately 5 minutes.   Multiple Vitamin (MULTIVITAMIN) tablet Take 1 tablet by mouth daily.   No facility-administered encounter medications on file as of 01/04/2021.     Lab Results  Component Value Date   WBC 3.1 (L) 01/04/2021   HGB 13.3 01/04/2021   HCT 40.4 01/04/2021   PLT 290.0 01/04/2021   GLUCOSE 105 (H) 01/04/2021   CHOL 250 (H) 01/04/2021   TRIG 109.0 01/04/2021   HDL 67.50 01/04/2021   LDLDIRECT 138.3 07/07/2012   LDLCALC 161 (H) 01/04/2021   ALT 16 01/04/2021   AST 16 01/04/2021   NA 141 01/04/2021   K 3.6 01/04/2021   CL 104 01/04/2021   CREATININE 0.98 01/04/2021   BUN 15 01/04/2021   CO2 28 01/04/2021   TSH 2.59 01/04/2021   HGBA1C 6.2 01/04/2021    US THYROID  Result  Date: 11/06/2020 CLINICAL DATA:  Thyroid fullness EXAM: THYROID ULTRASOUND TECHNIQUE: Ultrasound examination of the thyroid gland and adjacent soft tissues was  performed. COMPARISON:  08/16/2012 FINDINGS: Parenchymal Echotexture: Moderately heterogenous Isthmus: 0.5 cm thickness, stable Right lobe: 3.5 x 1.1 x 1.6 cm, previously 4.3 x 2.3 x 1.7 Left lobe: 4.3 x 1.4 x 1.5 cm, previously 4.5 x 1.8 x 2 _________________________________________________________ Estimated total number of nodules >/= 1 cm: 2 Number of spongiform nodules >/=  2 cm not described below (TR1): 0 Number of mixed cystic and solid nodules >/= 1.5 cm not described below ([place]): 0 _________________________________________________________ Nodule # 1: 0.9 cm hyperechoic nodule without calcifications, superior right, previously 0.8; This nodule does NOT meet TI-RADS criteria for biopsy or dedicated follow-up. Nodule # 2: 2 x 1.7 x 1.4 cm inferior left nodule, previously 2.2 x 1.6 x 1.3; stability for greater than 5 years implies benignity. Nodule # 3: 0.7 cm complex nodule, inferior right, previously 0.6; This nodule does NOT meet TI-RADS criteria for biopsy or dedicated follow-up. Nodule # 4: 0.9 cm isoechoic nodule without calcifications, mid left; This nodule does NOT meet TI-RADS criteria for biopsy or dedicated follow-up. Nodule # 5: 1.7 cm isoechoic nodule without calcifications, inferior left; This nodule does NOT meet TI-RADS criteria for biopsy or dedicated follow-up. Nodule # 6: 0.7 cm hyperechoic nodule, isthmus left of midline, previously 0.5; This nodule does NOT meet TI-RADS criteria for biopsy or dedicated follow-up. IMPRESSION: 1. Normal-sized thyroid with small nodules. None meets criteria for biopsy or follow-up. The above is in keeping with the ACR TI-RADS recommendations - J Am Coll Radiol 2017;14:587-595. Electronically Signed   By: Lucrezia Europe M.D.   On: 11/06/2020 15:17       Assessment & Plan:   Problem List Items Addressed  This Visit     B12 deficiency    Taking liquid B12.  Recheck B12 level.       Essential hypertension, benign    Blood pressure elevated.  Discussed recommendation to start blood pressure medication.  She declines.  Discussed importance of treating blood pressure.  She continues to decline. Wants to follow her blood pressure.  Call with update.  Schedule f/u soon to reassess.       Relevant Orders   Basic metabolic panel (Completed)   History of colonic polyps    Colonoscopy 03/2018.  Recommended f/u in 5 years.       Hypercholesterolemia - Primary    Wants to hold on cholesterol medicatoin.   Low cholesterol diet and exercise.  Follow lipid panel.       Relevant Orders   Lipid panel (Completed)   Hepatic function panel (Completed)   Hyperglycemia    Low carb diet and exercise. Follow met b and a1c.       Relevant Orders   Hemoglobin A1c (Completed)   Stress    Increased stress as outlined.  Discussed.  Desires no further intervention at this time.  Follow.       Thyroid fullness    Had thyroid ultrasound 10/2020 - multiple nodules as outlined. Given slight increase and one nodule 1.7cm - discussed referral to endocrinology for further evaluation.  Will notify me when agreeable.       Relevant Orders   TSH (Completed)     Einar Pheasant, MD

## 2021-01-04 NOTE — Assessment & Plan Note (Addendum)
Had thyroid ultrasound 10/2020 - multiple nodules as outlined. Given slight increase and one nodule 1.7cm - discussed referral to endocrinology for further evaluation.  Will notify me when agreeable.

## 2021-01-07 LAB — METHYLMALONIC ACID, SERUM: Methylmalonic Acid, Quant: 92 nmol/L (ref 87–318)

## 2021-01-07 LAB — INTRINSIC FACTOR ANTIBODIES: Intrinsic Factor: NEGATIVE

## 2021-01-12 ENCOUNTER — Encounter: Payer: Self-pay | Admitting: Internal Medicine

## 2021-01-12 DIAGNOSIS — E538 Deficiency of other specified B group vitamins: Secondary | ICD-10-CM | POA: Insufficient documentation

## 2021-01-12 NOTE — Assessment & Plan Note (Signed)
Taking liquid B12.  Recheck B12 level.

## 2021-01-12 NOTE — Assessment & Plan Note (Signed)
Wants to hold on cholesterol medicatoin.   Low cholesterol diet and exercise.  Follow lipid panel.  

## 2021-01-12 NOTE — Assessment & Plan Note (Signed)
Low carb diet and exercise. Follow met b and a1c.  

## 2021-01-12 NOTE — Assessment & Plan Note (Signed)
Increased stress as outlined.  Discussed.  Desires no further intervention at this time.  Follow.  

## 2021-01-12 NOTE — Assessment & Plan Note (Signed)
Colonoscopy 03/2018.  Recommended f/u in 5 years.  

## 2021-01-12 NOTE — Assessment & Plan Note (Signed)
Blood pressure elevated.  Discussed recommendation to start blood pressure medication.  She declines.  Discussed importance of treating blood pressure.  She continues to decline. Wants to follow her blood pressure.  Call with update.  Schedule f/u soon to reassess.

## 2021-04-02 ENCOUNTER — Ambulatory Visit: Payer: BC Managed Care – PPO | Admitting: Internal Medicine

## 2021-06-11 ENCOUNTER — Ambulatory Visit: Payer: BC Managed Care – PPO | Admitting: Internal Medicine

## 2021-06-11 ENCOUNTER — Encounter: Payer: Self-pay | Admitting: Internal Medicine

## 2021-06-11 ENCOUNTER — Other Ambulatory Visit: Payer: Self-pay

## 2021-06-11 VITALS — BP 140/78 | HR 78 | Temp 97.8°F | Resp 16 | Ht 67.5 in | Wt 153.6 lb

## 2021-06-11 DIAGNOSIS — I10 Essential (primary) hypertension: Secondary | ICD-10-CM | POA: Diagnosis not present

## 2021-06-11 DIAGNOSIS — E7800 Pure hypercholesterolemia, unspecified: Secondary | ICD-10-CM

## 2021-06-11 DIAGNOSIS — E0789 Other specified disorders of thyroid: Secondary | ICD-10-CM

## 2021-06-11 DIAGNOSIS — Z86010 Personal history of colonic polyps: Secondary | ICD-10-CM

## 2021-06-11 DIAGNOSIS — F439 Reaction to severe stress, unspecified: Secondary | ICD-10-CM

## 2021-06-11 DIAGNOSIS — L989 Disorder of the skin and subcutaneous tissue, unspecified: Secondary | ICD-10-CM

## 2021-06-11 DIAGNOSIS — D72819 Decreased white blood cell count, unspecified: Secondary | ICD-10-CM | POA: Diagnosis not present

## 2021-06-11 DIAGNOSIS — E538 Deficiency of other specified B group vitamins: Secondary | ICD-10-CM | POA: Diagnosis not present

## 2021-06-11 DIAGNOSIS — R739 Hyperglycemia, unspecified: Secondary | ICD-10-CM | POA: Diagnosis not present

## 2021-06-11 LAB — LIPID PANEL
Cholesterol: 155 mg/dL (ref 0–200)
HDL: 52.3 mg/dL (ref >39.00)
LDL Cholesterol: 80 mg/dL (ref 0–99)
NonHDL: 102.26
Total CHOL/HDL Ratio: 3
Triglycerides: 111 mg/dL (ref 0.0–149.0)
VLDL: 22.2 mg/dL (ref 0.0–40.0)

## 2021-06-11 LAB — CBC WITH DIFFERENTIAL/PLATELET
Basophils Absolute: 0 10*3/uL (ref 0.0–0.1)
Basophils Relative: 0.6 % (ref 0.0–3.0)
Eosinophils Absolute: 0.1 10*3/uL (ref 0.0–0.7)
Eosinophils Relative: 2.1 % (ref 0.0–5.0)
HCT: 39.6 % (ref 36.0–46.0)
Hemoglobin: 12.9 g/dL (ref 12.0–15.0)
Lymphocytes Relative: 44.6 % (ref 12.0–46.0)
Lymphs Abs: 1.3 10*3/uL (ref 0.7–4.0)
MCHC: 32.5 g/dL (ref 30.0–36.0)
MCV: 80.2 fl (ref 78.0–100.0)
Monocytes Absolute: 0.3 10*3/uL (ref 0.1–1.0)
Monocytes Relative: 11.5 % (ref 3.0–12.0)
Neutro Abs: 1.2 10*3/uL — ABNORMAL LOW (ref 1.4–7.7)
Neutrophils Relative %: 41.2 % — ABNORMAL LOW (ref 43.0–77.0)
Platelets: 289 10*3/uL (ref 150.0–400.0)
RBC: 4.94 Mil/uL (ref 3.87–5.11)
RDW: 13.3 % (ref 11.5–15.5)
WBC: 2.9 10*3/uL — ABNORMAL LOW (ref 4.0–10.5)

## 2021-06-11 LAB — BASIC METABOLIC PANEL
BUN: 10 mg/dL (ref 6–23)
CO2: 30 mEq/L (ref 19–32)
Calcium: 9.4 mg/dL (ref 8.4–10.5)
Chloride: 102 mEq/L (ref 96–112)
Creatinine, Ser: 0.93 mg/dL (ref 0.40–1.20)
GFR: 65.13 mL/min (ref >60.00)
Glucose, Bld: 94 mg/dL (ref 70–99)
Potassium: 3.6 mEq/L (ref 3.5–5.1)
Sodium: 139 mEq/L (ref 135–145)

## 2021-06-11 LAB — HEMOGLOBIN A1C: Hgb A1c MFr Bld: 6.3 % (ref 4.6–6.5)

## 2021-06-11 LAB — HEPATIC FUNCTION PANEL
ALT: 25 U/L (ref 0–35)
AST: 24 U/L (ref 0–37)
Albumin: 4.5 g/dL (ref 3.5–5.2)
Alkaline Phosphatase: 85 U/L (ref 39–117)
Bilirubin, Direct: 0.1 mg/dL (ref 0.0–0.3)
Total Bilirubin: 0.6 mg/dL (ref 0.2–1.2)
Total Protein: 7.3 g/dL (ref 6.0–8.3)

## 2021-06-11 LAB — TSH: TSH: 1.79 u[IU]/mL (ref 0.35–5.50)

## 2021-06-11 LAB — VITAMIN B12: Vitamin B-12: 962 pg/mL — ABNORMAL HIGH (ref 211–911)

## 2021-06-11 NOTE — Progress Notes (Signed)
Patient ID: Cheryl Powers, female   DOB: July 31, 1955, 66 y.o.   MRN: [id number]   Subjective:    Patient ID: Cheryl Powers, female    DOB: 12-08-1955, 66 y.o.   MRN: [id number]  This visit occurred during the SARS-CoV-2 public health emergency.  Safety protocols were in place, including screening questions prior to the visit, additional usage of staff PPE, and extensive cleaning of exam room while observing appropriate contact time as indicated for disinfecting solutions.   Patient here for a scheduled follow up .   HPI Here to follow up regarding her blood pressure and thyroid.  Increased stress.  Discussed.  Stress with work and marriage.  Does not feel needs any further intervention at this time.  Has support from her sister.  Is staying active.  Walks a lot.  No chest pain or sob with increased activity.  Has adjusted her diet.  Doing well with this.  No acid reflux or abdominal pain reported.  No bowel change reported.  Does report thinning of her hair.  Reports mother had thin hair.  Right wrist lesion.  Recently evaluated and treated for ring worm.  Persistent.  No itching now.     Past Medical History:  Diagnosis Date   Dysrhythmia    PSVT   Hypercholesterolemia    Hypertension    Palpitations    SOBOE (shortness of breath on exertion)    Past Surgical History:  Procedure Laterality Date   CERVICAL POLYP REMOVAL  2003, 2009   COLONOSCOPY  2012   COLONOSCOPY WITH PROPOFOL N/A 04/02/2018   Procedure: COLONOSCOPY WITH PROPOFOL;  Surgeon: Manya Silvas, MD;  Location: [facility] ENDOSCOPY;  Service: Endoscopy;  Laterality: N/A;   Family History  Problem Relation Age of Onset   Hypertension Father    Stroke Father 81   Heart attack Father    Diabetes Father        TYPE 2   Hyperlipidemia Father    Brain cancer Mother 51       Benign   Breast cancer Neg Hx    Social History   Socioeconomic History   Marital status: Married    Spouse name: Not on file   Number of  children: 1   Years of education: Not on file   Highest education level: Not on file  Occupational History   Not on file  Tobacco Use   Smoking status: Never   Smokeless tobacco: Never  Vaping Use   Vaping Use: Never used  Substance and Sexual Activity   Alcohol use: Yes    Alcohol/week: 0.0 standard drinks    Comment: glass of wine rarely   Drug use: No   Sexual activity: Yes    Partners: Male    Birth control/protection: Post-menopausal    Comment: Husband   Other Topics Concern   Not on file  Social History Narrative   Lives with husband    Daughter is grown and lives in [place]    Pets: None    Caffeine- No tea/coffee, dark chocolate only and occasionally    Right handed    Enjoys- HGTV   Social Determinants of Health   Financial Resource Strain: Not on file  Food Insecurity: Not on file  Transportation Needs: Not on file  Physical Activity: Not on file  Stress: Not on file  Social Connections: Not on file     Review of Systems  Constitutional:  Negative for appetite change, fever and unexpected weight change.  HENT:  Negative for congestion and sinus pressure.   Respiratory:  Negative for cough, chest tightness and shortness of breath.   Cardiovascular:  Negative for chest pain, palpitations and leg swelling.  Gastrointestinal:  Negative for abdominal pain, diarrhea, nausea and vomiting.  Genitourinary:  Negative for difficulty urinating and dysuria.  Musculoskeletal:  Negative for joint swelling and myalgias.  Skin:  Negative for color change.       Circular - lesion - right wrist.  No pain.   Neurological:  Negative for dizziness, light-headedness and headaches.  Psychiatric/Behavioral:  Negative for agitation and dysphoric mood.       Objective:     BP 140/78    Pulse 78    Temp 97.8 F (36.6 C)    Resp 16    Ht 5' 7.5" (1.715 m)    Wt 153 lb 9.6 oz (69.7 kg)    LMP 07/07/2008 (Approximate)    SpO2 99%    BMI 23.70 kg/m  Wt Readings from Last 3  Encounters:  06/11/21 153 lb 9.6 oz (69.7 kg)  01/04/21 153 lb 4 oz (69.5 kg)  09/03/20 148 lb 3.2 oz (67.2 kg)    Physical Exam Vitals reviewed.  Constitutional:      General: She is not in acute distress.    Appearance: Normal appearance.  HENT:     Head: Normocephalic and atraumatic.     Right Ear: External ear normal.     Left Ear: External ear normal.  Eyes:     General: No scleral icterus.       Right eye: No discharge.        Left eye: No discharge.     Conjunctiva/sclera: Conjunctivae normal.  Neck:     Thyroid: No thyromegaly.  Cardiovascular:     Rate and Rhythm: Normal rate and regular rhythm.  Pulmonary:     Effort: No respiratory distress.     Breath sounds: Normal breath sounds. No wheezing.  Abdominal:     General: Bowel sounds are normal.     Palpations: Abdomen is soft.     Tenderness: There is no abdominal tenderness.  Musculoskeletal:        General: No swelling or tenderness.     Cervical back: Neck supple. No tenderness.  Lymphadenopathy:     Cervical: No cervical adenopathy.  Skin:    Findings: No erythema.     Comments: Circular lesion - right wrist.    Neurological:     Mental Status: She is alert.  Psychiatric:        Mood and Affect: Mood normal.        Behavior: Behavior normal.     Outpatient Encounter Medications as of 06/11/2021  Medication Sig   TURMERIC PO Take by mouth.   [DISCONTINUED] carbamide peroxide (DEBROX) 6.5 % OTIC solution 4-5 drops right ear q day.  Massage for approximately 5 minutes.   [DISCONTINUED] Multiple Vitamin (MULTIVITAMIN) tablet Take 1 tablet by mouth daily.   No facility-administered encounter medications on file as of 06/11/2021.     Lab Results  Component Value Date   WBC 2.9 (L) 06/11/2021   HGB 12.9 06/11/2021   HCT 39.6 06/11/2021   PLT 289.0 06/11/2021   GLUCOSE 94 06/11/2021   CHOL 155 06/11/2021   TRIG 111.0 06/11/2021   HDL 52.30 06/11/2021   LDLDIRECT 138.3 07/07/2012   LDLCALC 80  06/11/2021   ALT 25 06/11/2021   AST 24 06/11/2021   NA 139 06/11/2021  K 3.6 06/11/2021   CL 102 06/11/2021   CREATININE 0.93 06/11/2021   BUN 10 06/11/2021   CO2 30 06/11/2021   TSH 1.79 06/11/2021   HGBA1C 6.3 06/11/2021    US THYROID  Result Date: 11/06/2020 CLINICAL DATA:  Thyroid fullness EXAM: THYROID ULTRASOUND TECHNIQUE: Ultrasound examination of the thyroid gland and adjacent soft tissues was performed. COMPARISON:  08/16/2012 FINDINGS: Parenchymal Echotexture: Moderately heterogenous Isthmus: 0.5 cm thickness, stable Right lobe: 3.5 x 1.1 x 1.6 cm, previously 4.3 x 2.3 x 1.7 Left lobe: 4.3 x 1.4 x 1.5 cm, previously 4.5 x 1.8 x 2 _________________________________________________________ Estimated total number of nodules >/= 1 cm: 2 Number of spongiform nodules >/=  2 cm not described below (TR1): 0 Number of mixed cystic and solid nodules >/= 1.5 cm not described below ([place]): 0 _________________________________________________________ Nodule # 1: 0.9 cm hyperechoic nodule without calcifications, superior right, previously 0.8; This nodule does NOT meet TI-RADS criteria for biopsy or dedicated follow-up. Nodule # 2: 2 x 1.7 x 1.4 cm inferior left nodule, previously 2.2 x 1.6 x 1.3; stability for greater than 5 years implies benignity. Nodule # 3: 0.7 cm complex nodule, inferior right, previously 0.6; This nodule does NOT meet TI-RADS criteria for biopsy or dedicated follow-up. Nodule # 4: 0.9 cm isoechoic nodule without calcifications, mid left; This nodule does NOT meet TI-RADS criteria for biopsy or dedicated follow-up. Nodule # 5: 1.7 cm isoechoic nodule without calcifications, inferior left; This nodule does NOT meet TI-RADS criteria for biopsy or dedicated follow-up. Nodule # 6: 0.7 cm hyperechoic nodule, isthmus left of midline, previously 0.5; This nodule does NOT meet TI-RADS criteria for biopsy or dedicated follow-up. IMPRESSION: 1. Normal-sized thyroid with small nodules. None  meets criteria for biopsy or follow-up. The above is in keeping with the ACR TI-RADS recommendations - J Am Coll Radiol 2017;14:587-595. Electronically Signed   By: Lucrezia Europe M.D.   On: 11/06/2020 15:17       Assessment & Plan:   Problem List Items Addressed This Visit     B12 deficiency    Taking oral B12.  Recheck B12 level today.       Relevant Orders   Vitamin B12 (Completed)   Essential hypertension, benign    Blood pressure elevated.  Discussed recommendation to start blood pressure medication.  She declines.  Discussed importance of treating blood pressure.  She continues to decline. Wants to follow her blood pressure.  Call with update.  Schedule f/u soon to reassess.       History of colonic polyps    Colonoscopy 03/2018.  Recommended f/u in 5 years.       Hypercholesterolemia - Primary    Wants to hold on cholesterol medicatoin.   Low cholesterol diet and exercise.  Follow lipid panel.       Relevant Orders   Lipid panel (Completed)   Hepatic function panel (Completed)   Basic metabolic panel (Completed)   Hyperglycemia    Low carb diet and exercise. Follow met b and a1c.       Relevant Orders   Hemoglobin A1c (Completed)   Leukopenia    Recheck cbc today.       Relevant Orders   CBC with Differential/Platelet (Completed)   Skin lesion    Circular area - wrist.  Recently treated for ringworm.  Persistent.  Given persistence, request dermatology referral.        Relevant Orders   Ambulatory referral to Dermatology   Stress  Increased stress as outlined.  Discussed.  Desires no further intervention at this time.  Follow.       Thyroid fullness    Had thyroid ultrasound 10/2020 - multiple nodules as outlined. Given slight increase and one nodule 1.7cm - discussed referral to endocrinology for further evaluation.  Has previously declined.  Discussed today. Will notify me when agreeable.       Relevant Orders   TSH (Completed)     Einar Pheasant, MD

## 2021-06-12 ENCOUNTER — Other Ambulatory Visit: Payer: Self-pay

## 2021-06-12 ENCOUNTER — Encounter: Payer: Self-pay | Admitting: Internal Medicine

## 2021-06-12 DIAGNOSIS — D72819 Decreased white blood cell count, unspecified: Secondary | ICD-10-CM

## 2021-06-12 DIAGNOSIS — L989 Disorder of the skin and subcutaneous tissue, unspecified: Secondary | ICD-10-CM | POA: Insufficient documentation

## 2021-06-12 NOTE — Assessment & Plan Note (Signed)
Recheck cbc today.   

## 2021-06-12 NOTE — Assessment & Plan Note (Signed)
Blood pressure elevated.  Discussed recommendation to start blood pressure medication.  She declines.  Discussed importance of treating blood pressure.  She continues to decline. Wants to follow her blood pressure.  Call with update.  Schedule f/u soon to reassess.

## 2021-06-12 NOTE — Assessment & Plan Note (Signed)
Increased stress as outlined.  Discussed.  Desires no further intervention at this time.  Follow.  

## 2021-06-12 NOTE — Assessment & Plan Note (Signed)
Circular area - wrist.  Recently treated for ringworm.  Persistent.  Given persistence, request dermatology referral.

## 2021-06-12 NOTE — Assessment & Plan Note (Signed)
Had thyroid ultrasound 10/2020 - multiple nodules as outlined. Given slight increase and one nodule 1.7cm - discussed referral to endocrinology for further evaluation.  Has previously declined.  Discussed today. Will notify me when agreeable.

## 2021-06-12 NOTE — Assessment & Plan Note (Signed)
Taking oral B12.  Recheck B12 level today.

## 2021-06-12 NOTE — Assessment & Plan Note (Signed)
Wants to hold on cholesterol medicatoin.   Low cholesterol diet and exercise.  Follow lipid panel.  

## 2021-06-12 NOTE — Assessment & Plan Note (Signed)
Colonoscopy 03/2018.  Recommended f/u in 5 years.  

## 2021-06-12 NOTE — Assessment & Plan Note (Signed)
Low carb diet and exercise.  Follow met b and a1c.   

## 2021-07-19 ENCOUNTER — Other Ambulatory Visit (INDEPENDENT_AMBULATORY_CARE_PROVIDER_SITE_OTHER): Payer: BC Managed Care – PPO

## 2021-07-19 DIAGNOSIS — D72819 Decreased white blood cell count, unspecified: Secondary | ICD-10-CM | POA: Diagnosis not present

## 2021-07-19 LAB — CBC WITH DIFFERENTIAL/PLATELET
Basophils Absolute: 0 10*3/uL (ref 0.0–0.1)
Basophils Relative: 1.1 % (ref 0.0–3.0)
Eosinophils Absolute: 0.1 10*3/uL (ref 0.0–0.7)
Eosinophils Relative: 3 % (ref 0.0–5.0)
HCT: 39.5 % (ref 36.0–46.0)
Hemoglobin: 13.3 g/dL (ref 12.0–15.0)
Lymphocytes Relative: 41.9 % (ref 12.0–46.0)
Lymphs Abs: 1.5 10*3/uL (ref 0.7–4.0)
MCHC: 33.7 g/dL (ref 30.0–36.0)
MCV: 80.2 fl (ref 78.0–100.0)
Monocytes Absolute: 0.3 10*3/uL (ref 0.1–1.0)
Monocytes Relative: 8.4 % (ref 3.0–12.0)
Neutro Abs: 1.6 10*3/uL (ref 1.4–7.7)
Neutrophils Relative %: 45.6 % (ref 43.0–77.0)
Platelets: 311 10*3/uL (ref 150.0–400.0)
RBC: 4.93 Mil/uL (ref 3.87–5.11)
RDW: 13.1 % (ref 11.5–15.5)
WBC: 3.5 10*3/uL — ABNORMAL LOW (ref 4.0–10.5)

## 2021-08-28 ENCOUNTER — Other Ambulatory Visit: Payer: Self-pay | Admitting: Internal Medicine

## 2021-08-28 DIAGNOSIS — Z1231 Encounter for screening mammogram for malignant neoplasm of breast: Secondary | ICD-10-CM

## 2021-10-01 ENCOUNTER — Ambulatory Visit
Admission: RE | Admit: 2021-10-01 | Discharge: 2021-10-01 | Disposition: A | Discharge status: Home / Self Care | Payer: BC Managed Care – PPO | Source: Ambulatory Visit | Attending: Internal Medicine | Admitting: Internal Medicine

## 2021-10-01 DIAGNOSIS — Z1231 Encounter for screening mammogram for malignant neoplasm of breast: Secondary | ICD-10-CM | POA: Diagnosis not present

## 2021-11-15 ENCOUNTER — Encounter: Payer: Self-pay | Admitting: Internal Medicine

## 2021-11-20 ENCOUNTER — Ambulatory Visit: Payer: BC Managed Care – PPO | Admitting: Dermatology

## 2021-11-20 DIAGNOSIS — L821 Other seborrheic keratosis: Secondary | ICD-10-CM

## 2021-11-20 DIAGNOSIS — L649 Androgenic alopecia, unspecified: Secondary | ICD-10-CM

## 2021-11-20 DIAGNOSIS — L578 Other skin changes due to chronic exposure to nonionizing radiation: Secondary | ICD-10-CM

## 2021-11-20 DIAGNOSIS — L905 Scar conditions and fibrosis of skin: Secondary | ICD-10-CM

## 2021-11-20 DIAGNOSIS — L308 Other specified dermatitis: Secondary | ICD-10-CM | POA: Diagnosis not present

## 2021-11-20 DIAGNOSIS — D2262 Melanocytic nevi of left upper limb, including shoulder: Secondary | ICD-10-CM

## 2021-11-20 DIAGNOSIS — Q833 Accessory nipple: Secondary | ICD-10-CM

## 2021-11-20 DIAGNOSIS — D239 Other benign neoplasm of skin, unspecified: Secondary | ICD-10-CM

## 2021-11-20 DIAGNOSIS — L810 Postinflammatory hyperpigmentation: Secondary | ICD-10-CM

## 2021-11-20 DIAGNOSIS — D1800 Hemangioma unspecified site: Secondary | ICD-10-CM

## 2021-11-20 DIAGNOSIS — D229 Melanocytic nevi, unspecified: Secondary | ICD-10-CM

## 2021-11-20 DIAGNOSIS — Z1283 Encounter for screening for malignant neoplasm of skin: Secondary | ICD-10-CM | POA: Diagnosis not present

## 2021-11-20 DIAGNOSIS — L814 Other melanin hyperpigmentation: Secondary | ICD-10-CM

## 2021-11-20 DIAGNOSIS — D2371 Other benign neoplasm of skin of right lower limb, including hip: Secondary | ICD-10-CM

## 2021-11-20 MED ORDER — TRIAMCINOLONE ACETONIDE 0.1 % EX CREA: 1.0000 | TOPICAL_CREAM | Freq: Every day | CUTANEOUS | 2 refills | Status: AC
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
## 2021-11-20 NOTE — Patient Instructions (Signed)
Due to recent changes in healthcare laws, you may see results of your pathology and/or laboratory studies on MyChart before the doctors have had a chance to review them. We understand that in some cases there may be results that are confusing or concerning to you. Please understand that not all results are received at the same time and often the doctors may need to interpret multiple results in order to provide you with the best plan of care or course of treatment. Therefore, we ask that you please give us 2 business days to thoroughly review all your results before contacting the office for clarification. Should we see a critical lab result, you will be contacted sooner.   If You Need Anything After Your Visit  If you have any questions or concerns for your doctor, please call our main line at [phone] and press option 4 to reach your doctor's medical assistant. If no one answers, please leave a voicemail as directed and we will return your call as soon as possible. Messages left after 4 pm will be answered the following business day.   You may also send us a message via MyChart. We typically respond to MyChart messages within 1-2 business days.  For prescription refills, please ask your pharmacy to contact our office. Our fax number is [phone].  If you have an urgent issue when the clinic is closed that cannot wait until the next business day, you can page your doctor at the number below.    Please note that while we do our best to be available for urgent issues outside of office hours, we are not available 24/7.   If you have an urgent issue and are unable to reach us, you may choose to seek medical care at your doctor's office, retail clinic, urgent care center, or emergency room.  If you have a medical emergency, please immediately call 911 or go to the emergency department.  Pager Numbers  - Dr. Kowalski: [phone]  - Dr. Moye: [phone]  - Dr. Stewart:  [phone]  In the event of inclement weather, please call our main line at [phone] for an update on the status of any delays or closures.  Dermatology Medication Tips: Please keep the boxes that topical medications come in in order to help keep track of the instructions about where and how to use these. Pharmacies typically print the medication instructions only on the boxes and not directly on the medication tubes.   If your medication is too expensive, please contact our office at [phone] option 4 or send us a message through MyChart.   We are unable to tell what your co-pay for medications will be in advance as this is different depending on your insurance coverage. However, we may be able to find a substitute medication at lower cost or fill out paperwork to get insurance to cover a needed medication.   If a prior authorization is required to get your medication covered by your insurance company, please allow us 1-2 business days to complete this process.  Drug prices often vary depending on where the prescription is filled and some pharmacies may offer cheaper prices.  The website www.goodrx.com contains coupons for medications through different pharmacies. The prices here do not account for what the cost may be with help from insurance (it may be cheaper with your insurance), but the website can give you the price if you did not use any insurance.  - You can print the associated coupon and take it with   your prescription to the pharmacy.  - You may also stop by our office during regular business hours and pick up a GoodRx coupon card.  - If you need your prescription sent electronically to a different pharmacy, notify our office through [place] MyChart or by phone at [phone] option 4.     Si Usted Necesita Algo Despus de Su Visita  Tambin puede enviarnos un mensaje a travs de MyChart. Por lo general respondemos a los mensajes de MyChart en el transcurso de 1 a 2  das hbiles.  Para renovar recetas, por favor pida a su farmacia que se ponga en contacto con nuestra oficina. Nuestro nmero de fax es el [phone].  Si tiene un asunto urgente cuando la clnica est cerrada y que no puede esperar hasta el siguiente da hbil, puede llamar/localizar a su doctor(a) al nmero que aparece a continuacin.   Por favor, tenga en cuenta que aunque hacemos todo lo posible para estar disponibles para asuntos urgentes fuera del horario de oficina, no estamos disponibles las 24 horas del da, los 7 das de la semana.   Si tiene un problema urgente y no puede comunicarse con nosotros, puede optar por buscar atencin mdica  en el consultorio de su doctor(a), en una clnica privada, en un centro de atencin urgente o en una sala de emergencias.  Si tiene una emergencia mdica, por favor llame inmediatamente al 911 o vaya a la sala de emergencias.  Nmeros de bper  - Dr. Kowalski: [phone]  - Dra. Moye: [phone]  - Dra. Stewart: [phone]  En caso de inclemencias del tiempo, por favor llame a nuestra lnea principal al [phone] para una actualizacin sobre el estado de cualquier retraso o cierre.  Consejos para la medicacin en dermatologa: Por favor, guarde las cajas en las que vienen los medicamentos de uso tpico para ayudarle a seguir las instrucciones sobre dnde y cmo usarlos. Las farmacias generalmente imprimen las instrucciones del medicamento slo en las cajas y no directamente en los tubos del medicamento.   Si su medicamento es muy caro, por favor, pngase en contacto con nuestra oficina llamando al [phone] y presione la opcin 4 o envenos un mensaje a travs de MyChart.   No podemos decirle cul ser su copago por los medicamentos por adelantado ya que esto es diferente dependiendo de la cobertura de su seguro. Sin embargo, es posible que podamos encontrar un medicamento sustituto a menor costo o llenar un formulario para que el  seguro cubra el medicamento que se considera necesario.   Si se requiere una autorizacin previa para que su compaa de seguros cubra su medicamento, por favor permtanos de 1 a 2 das hbiles para completar este proceso.  Los precios de los medicamentos varan con frecuencia dependiendo del lugar de dnde se surte la receta y alguna farmacias pueden ofrecer precios ms baratos.  El sitio web www.goodrx.com tiene cupones para medicamentos de diferentes farmacias. Los precios aqu no tienen en cuenta lo que podra costar con la ayuda del seguro (puede ser ms barato con su seguro), pero el sitio web puede darle el precio si no utiliz ningn seguro.  - Puede imprimir el cupn correspondiente y llevarlo con su receta a la farmacia.  - Tambin puede pasar por nuestra oficina durante el horario de atencin regular y recoger una tarjeta de cupones de GoodRx.  - Si necesita que su receta se enve electrnicamente a una farmacia diferente, informe a nuestra oficina a travs de MyChart de [facility] [place]   o por telfono llamando al [phone] y presione la opcin 4.  

## 2021-11-21 ENCOUNTER — Encounter: Payer: Self-pay | Admitting: Dermatology

## 2022-02-03 ENCOUNTER — Other Ambulatory Visit: Payer: Self-pay | Admitting: *Deleted

## 2022-02-04 ENCOUNTER — Encounter: Payer: Self-pay | Admitting: Internal Medicine

## 2022-02-04 ENCOUNTER — Ambulatory Visit (INDEPENDENT_AMBULATORY_CARE_PROVIDER_SITE_OTHER): Payer: BC Managed Care – PPO | Admitting: Internal Medicine

## 2022-02-04 VITALS — BP 134/60 | HR 68 | Temp 99.1°F | Resp 16 | Ht 67.5 in | Wt 151.0 lb

## 2022-02-04 DIAGNOSIS — Z86010 Personal history of colonic polyps: Secondary | ICD-10-CM

## 2022-02-04 DIAGNOSIS — L659 Nonscarring hair loss, unspecified: Secondary | ICD-10-CM

## 2022-02-04 DIAGNOSIS — D72819 Decreased white blood cell count, unspecified: Secondary | ICD-10-CM

## 2022-02-04 DIAGNOSIS — I10 Essential (primary) hypertension: Secondary | ICD-10-CM

## 2022-02-04 DIAGNOSIS — Z0000 Encounter for general adult medical examination without abnormal findings: Secondary | ICD-10-CM

## 2022-02-04 DIAGNOSIS — E7800 Pure hypercholesterolemia, unspecified: Secondary | ICD-10-CM

## 2022-02-04 DIAGNOSIS — E538 Deficiency of other specified B group vitamins: Secondary | ICD-10-CM

## 2022-02-04 DIAGNOSIS — R739 Hyperglycemia, unspecified: Secondary | ICD-10-CM

## 2022-02-04 DIAGNOSIS — F439 Reaction to severe stress, unspecified: Secondary | ICD-10-CM

## 2022-02-04 DIAGNOSIS — E0789 Other specified disorders of thyroid: Secondary | ICD-10-CM

## 2022-02-04 LAB — CBC WITH DIFFERENTIAL/PLATELET
Basophils Absolute: 0 10*3/uL (ref 0.0–0.1)
Basophils Relative: 0.7 % (ref 0.0–3.0)
Eosinophils Absolute: 0.1 10*3/uL (ref 0.0–0.7)
Eosinophils Relative: 2.7 % (ref 0.0–5.0)
HCT: 40.3 % (ref 36.0–46.0)
Hemoglobin: 13.4 g/dL (ref 12.0–15.0)
Lymphocytes Relative: 38.5 % (ref 12.0–46.0)
Lymphs Abs: 1.2 10*3/uL (ref 0.7–4.0)
MCHC: 33.2 g/dL (ref 30.0–36.0)
MCV: 81.1 fl (ref 78.0–100.0)
Monocytes Absolute: 0.3 10*3/uL (ref 0.1–1.0)
Monocytes Relative: 8.4 % (ref 3.0–12.0)
Neutro Abs: 1.6 10*3/uL (ref 1.4–7.7)
Neutrophils Relative %: 49.7 % (ref 43.0–77.0)
Platelets: 297 10*3/uL (ref 150.0–400.0)
RBC: 4.96 Mil/uL (ref 3.87–5.11)
RDW: 13.3 % (ref 11.5–15.5)
WBC: 3.1 10*3/uL — ABNORMAL LOW (ref 4.0–10.5)

## 2022-02-04 LAB — BASIC METABOLIC PANEL
BUN: 13 mg/dL (ref 6–23)
CO2: 30 mEq/L (ref 19–32)
Calcium: 9.6 mg/dL (ref 8.4–10.5)
Chloride: 103 mEq/L (ref 96–112)
Creatinine, Ser: 0.87 mg/dL (ref 0.40–1.20)
GFR: 70.24 mL/min (ref >60.00)
Glucose, Bld: 101 mg/dL — ABNORMAL HIGH (ref 70–99)
Potassium: 3.8 mEq/L (ref 3.5–5.1)
Sodium: 140 mEq/L (ref 135–145)

## 2022-02-04 LAB — LIPID PANEL
Cholesterol: 230 mg/dL — ABNORMAL HIGH (ref 0–200)
HDL: 75.4 mg/dL (ref >39.00)
LDL Cholesterol: 132 mg/dL — ABNORMAL HIGH (ref 0–99)
NonHDL: 154.96
Total CHOL/HDL Ratio: 3
Triglycerides: 114 mg/dL (ref 0.0–149.0)
VLDL: 22.8 mg/dL (ref 0.0–40.0)

## 2022-02-04 LAB — HEPATIC FUNCTION PANEL
ALT: 19 U/L (ref 0–35)
AST: 20 U/L (ref 0–37)
Albumin: 4.7 g/dL (ref 3.5–5.2)
Alkaline Phosphatase: 82 U/L (ref 39–117)
Bilirubin, Direct: 0.1 mg/dL (ref 0.0–0.3)
Total Bilirubin: 0.7 mg/dL (ref 0.2–1.2)
Total Protein: 7.6 g/dL (ref 6.0–8.3)

## 2022-02-04 LAB — HEMOGLOBIN A1C: Hgb A1c MFr Bld: 6.3 % (ref 4.6–6.5)

## 2022-02-04 LAB — TSH: TSH: 1.32 u[IU]/mL (ref 0.35–5.50)

## 2022-02-04 NOTE — Assessment & Plan Note (Signed)
Physical today 02/04/22.  PAP 09/03/20 - negative with negative HPV.  Mammogram 10/01/21 - Birads I.  Colonoscopy 03/2018 - recommended f/u in 5 years.

## 2022-02-04 NOTE — Progress Notes (Signed)
Patient ID: Cheryl Powers, female   DOB: 1956/03/29, 66 y.o.   MRN: [id number]   Subjective:    Patient ID: Cheryl Powers, female    DOB: 1956-04-02, 66 y.o.   MRN: [id number]   Patient here for  Chief Complaint  Patient presents with   Annual Exam   .   HPI Here for physical exam. Has been dealing with increased stress. States she is handling things well.  Dos not feel needs any further intervention.  No headache or dizziness reported.  No cough or congestion reported.  No abdominal pain. Bowels moving.  Blood pressure - outside checks - 130/60-70.  Has declined blood pressure medication.  Thyroid nodule.  Discussed f/u - declines.  Request referral to dermatology.     Past Medical History:  Diagnosis Date   Dysrhythmia    PSVT   Hypercholesterolemia    Hypertension    Palpitations    SOBOE (shortness of breath on exertion)    Past Surgical History:  Procedure Laterality Date   CERVICAL POLYP REMOVAL  2003, 2009   COLONOSCOPY  2012   COLONOSCOPY WITH PROPOFOL N/A 04/02/2018   Procedure: COLONOSCOPY WITH PROPOFOL;  Surgeon: Manya Silvas, MD;  Location[facility] ENDOSCOPY;  Service: Endoscopy;  Laterality: N/A;   Family History  Problem Relation Age of Onset   Hypertension Father    Stroke Father 43   Heart attack Father    Diabetes Father        TYPE 2   Hyperlipidemia Father    Brain cancer Mother 58       Benign   Breast cancer Neg Hx    Social History   Socioeconomic History   Marital status: Married    Spouse name: Not on file   Number of children: 1   Years of education: Not on file   Highest education level: Not on file  Occupational History   Not on file  Tobacco Use   Smoking status: Never   Smokeless tobacco: Never  Vaping Use   Vaping Use: Never used  Substance and Sexual Activity   Alcohol use: Yes    Alcohol/week: 0.0 standard drinks of alcohol    Comment: glass of wine rarely   Drug use: No   Sexual activity: Yes    Partners: Male     Birth control/protection: Post-menopausal    Comment: Husband   Other Topics Concern   Not on file  Social History Narrative   Lives with husband    Daughter is grown and lives in [place]    Pets: None    Caffeine- No tea/coffee, dark chocolate only and occasionally    Right handed    Enjoys- HGTV   Social Determinants of Health   Financial Resource Strain: Not on file  Food Insecurity: Not on file  Transportation Needs: Not on file  Physical Activity: Not on file  Stress: Not on file  Social Connections: Not on file     Review of Systems  Constitutional:  Negative for appetite change and unexpected weight change.  HENT:  Negative for congestion, sinus pressure and sore throat.   Eyes:  Negative for pain and visual disturbance.  Respiratory:  Negative for cough, chest tightness and shortness of breath.   Cardiovascular:  Negative for chest pain, palpitations and leg swelling.  Gastrointestinal:  Negative for abdominal pain, diarrhea, nausea and vomiting.  Genitourinary:  Negative for difficulty urinating and dysuria.  Musculoskeletal:  Negative for joint swelling and myalgias.  Skin:  Negative for color change and rash.  Neurological:  Negative for dizziness, light-headedness and headaches.  Hematological:  Negative for adenopathy. Does not bruise/bleed easily.  Psychiatric/Behavioral:  Negative for agitation and dysphoric mood.        Objective:     BP 134/60 (BP Location: Left Arm, Patient Position: Sitting, Cuff Size: Normal)   Pulse 68   Temp 99.1 F (37.3 C) (Oral)   Resp 16   Ht 5' 7.5" (1.715 m)   Wt 151 lb (68.5 kg)   LMP 07/07/2008 (Approximate)   SpO2 97%   BMI 23.30 kg/m  Wt Readings from Last 3 Encounters:  02/04/22 151 lb (68.5 kg)  06/11/21 153 lb 9.6 oz (69.7 kg)  01/04/21 153 lb 4 oz (69.5 kg)    Physical Exam Vitals reviewed.  Constitutional:      General: She is not in acute distress.    Appearance: Normal appearance. She is  well-developed.  HENT:     Head: Normocephalic and atraumatic.     Right Ear: External ear normal.     Left Ear: External ear normal.  Eyes:     General: No scleral icterus.       Right eye: No discharge.        Left eye: No discharge.     Conjunctiva/sclera: Conjunctivae normal.  Neck:     Thyroid: No thyromegaly.  Cardiovascular:     Rate and Rhythm: Normal rate and regular rhythm.  Pulmonary:     Effort: No tachypnea, accessory muscle usage or respiratory distress.     Breath sounds: Normal breath sounds. No decreased breath sounds or wheezing.  Chest:  Breasts:    Right: No inverted nipple, mass, nipple discharge or tenderness (no axillary adenopathy).     Left: No inverted nipple, mass, nipple discharge or tenderness (no axilarry adenopathy).  Abdominal:     General: Bowel sounds are normal.     Palpations: Abdomen is soft.     Tenderness: There is no abdominal tenderness.  Musculoskeletal:        General: No swelling or tenderness.     Cervical back: Neck supple.  Lymphadenopathy:     Cervical: No cervical adenopathy.  Skin:    Findings: No erythema or rash.  Neurological:     Mental Status: She is alert and oriented to person, place, and time.  Psychiatric:        Mood and Affect: Mood normal.        Behavior: Behavior normal.      Outpatient Encounter Medications as of 02/04/2022  Medication Sig   triamcinolone cream (KENALOG) 0.1 % Apply 1 Application topically daily. Avoid face, groin, underarms   TURMERIC PO Take by mouth.   No facility-administered encounter medications on file as of 02/04/2022.     Lab Results  Component Value Date   WBC 3.1 (L) 02/04/2022   HGB 13.4 02/04/2022   HCT 40.3 02/04/2022   PLT 297.0 02/04/2022   GLUCOSE 101 (H) 02/04/2022   CHOL 230 (H) 02/04/2022   TRIG 114.0 02/04/2022   HDL 75.40 02/04/2022   LDLDIRECT 138.3 07/07/2012   LDLCALC 132 (H) 02/04/2022   ALT 19 02/04/2022   AST 20 02/04/2022   NA 140 02/04/2022    K 3.8 02/04/2022   CL 103 02/04/2022   CREATININE 0.87 02/04/2022   BUN 13 02/04/2022   CO2 30 02/04/2022   TSH 1.32 02/04/2022   HGBA1C 6.3 02/04/2022    MM 3D SCREEN  BREAST BILATERAL  Result Date: 10/02/2021 CLINICAL DATA:  Screening. EXAM: DIGITAL SCREENING BILATERAL MAMMOGRAM WITH TOMOSYNTHESIS AND CAD TECHNIQUE: Bilateral screening digital craniocaudal and mediolateral oblique mammograms were obtained. Bilateral screening digital breast tomosynthesis was performed. The images were evaluated with computer-aided detection. COMPARISON:  Previous exam(s). ACR Breast Density Category b: There are scattered areas of fibroglandular density. FINDINGS: There are no findings suspicious for malignancy. IMPRESSION: No mammographic evidence of malignancy. A result letter of this screening mammogram will be mailed directly to the patient. RECOMMENDATION: Screening mammogram in one year. (Code:SM-B-01Y) BI-RADS CATEGORY  1: Negative. Electronically Signed   By: Nolon Nations M.D.   On: 10/02/2021 12:41       Assessment & Plan:   Problem List Items Addressed This Visit     B12 deficiency    Check B12 level.       Essential hypertension, benign    Blood pressure elevated on my check.  Discussed variation in blood pressure and persistent intermittent spikes.  Discussed recommendation to start blood pressure medication.  Declines to start at this time.  Will monitor her blood pressure and send in readings.  Follow metabolic panel.       Relevant Orders   Basic Metabolic Panel (BMET) (Completed)   Hair loss    Requested referral to Dr Kellie Moor.       Relevant Orders   Ambulatory referral to Dermatology   Healthcare maintenance    Physical today 02/04/22.  PAP 09/03/20 - negative with negative HPV.  Mammogram 10/01/21 - Birads I.  Colonoscopy 03/2018 - recommended f/u in 5 years.       History of colonic polyps    Colonoscopy 03/2018.  Recommended f/u in 5 years.        Hypercholesterolemia    Wants to hold on cholesterol medicatoin.   Low cholesterol diet and exercise.  Follow lipid panel.       Relevant Orders   Hepatic function panel (Completed)   Lipid panel (Completed)   Hyperglycemia    Low carb diet and exercise. Follow met b and a1c.       Relevant Orders   Hemoglobin A1c (Completed)   Leukopenia    Recheck cbc.       Relevant Orders   CBC w/Diff (Completed)   Stress    Increased stress as outlined.  Discussed.  Desires no further intervention at this time.  Follow.       Thyroid fullness    Had thyroid ultrasound 10/2020 - multiple nodules as outlined. Given slight increase and one nodule 1.7cm - discussed referral to endocrinology for further evaluation.  Has previously declined.  Discussed today and discussed f/u thyroid ultrasound. Will notify me when agreeable.       Relevant Orders   TSH (Completed)   Other Visit Diagnoses     Routine general medical examination at a health care facility    -  Primary        Einar Pheasant, MD

## 2022-02-05 DIAGNOSIS — H52223 Regular astigmatism, bilateral: Secondary | ICD-10-CM | POA: Diagnosis not present

## 2022-02-05 DIAGNOSIS — H5203 Hypermetropia, bilateral: Secondary | ICD-10-CM | POA: Diagnosis not present

## 2022-02-05 DIAGNOSIS — H2513 Age-related nuclear cataract, bilateral: Secondary | ICD-10-CM | POA: Diagnosis not present

## 2022-02-05 DIAGNOSIS — H524 Presbyopia: Secondary | ICD-10-CM | POA: Diagnosis not present

## 2022-02-09 ENCOUNTER — Encounter: Payer: Self-pay | Admitting: Internal Medicine

## 2022-02-09 DIAGNOSIS — L659 Nonscarring hair loss, unspecified: Secondary | ICD-10-CM | POA: Insufficient documentation

## 2022-02-09 NOTE — Assessment & Plan Note (Signed)
Recheck cbc.  

## 2022-02-09 NOTE — Assessment & Plan Note (Signed)
Check B12 level. 

## 2022-02-09 NOTE — Assessment & Plan Note (Signed)
Had thyroid ultrasound 10/2020 - multiple nodules as outlined. Given slight increase and one nodule 1.7cm - discussed referral to endocrinology for further evaluation.  Has previously declined.  Discussed today and discussed f/u thyroid ultrasound. Will notify me when agreeable.

## 2022-02-09 NOTE — Assessment & Plan Note (Signed)
Wants to hold on cholesterol medicatoin.   Low cholesterol diet and exercise.  Follow lipid panel.

## 2022-02-09 NOTE — Assessment & Plan Note (Signed)
Requested referral to Dr Kellie Moor.

## 2022-02-09 NOTE — Assessment & Plan Note (Signed)
Low carb diet and exercise. Follow met b and a1c.  

## 2022-02-09 NOTE — Assessment & Plan Note (Signed)
Blood pressure elevated on my check.  Discussed variation in blood pressure and persistent intermittent spikes.  Discussed recommendation to start blood pressure medication.  Declines to start at this time.  Will monitor her blood pressure and send in readings.  Follow metabolic panel.

## 2022-02-09 NOTE — Assessment & Plan Note (Signed)
Increased stress as outlined.  Discussed.  Desires no further intervention at this time.  Follow.

## 2022-02-09 NOTE — Assessment & Plan Note (Signed)
Colonoscopy 03/2018.  Recommended f/u in 5 years.

## 2022-03-31 ENCOUNTER — Encounter: Payer: Self-pay | Admitting: Dermatology

## 2022-03-31 ENCOUNTER — Ambulatory Visit: Payer: BC Managed Care – PPO | Admitting: Dermatology

## 2022-03-31 DIAGNOSIS — L819 Disorder of pigmentation, unspecified: Secondary | ICD-10-CM

## 2022-03-31 DIAGNOSIS — L818 Other specified disorders of pigmentation: Secondary | ICD-10-CM

## 2022-03-31 DIAGNOSIS — L308 Other specified dermatitis: Secondary | ICD-10-CM

## 2022-03-31 NOTE — Progress Notes (Signed)
   Follow-Up Visit   Subjective  Cheryl Powers is a 66 y.o. female who presents for the following: Eczema (Back. Has been using Triamcinolone cream once monthly since getting rash under control).  The following portions of the chart were reviewed this encounter and updated as appropriate:  Tobacco  Allergies  Meds  Problems  Med Hx  Surg Hx  Fam Hx     Review of Systems: No other skin or systemic complaints except as noted in HPI or Assessment and Plan.  Objective  Well appearing patient in no apparent distress; mood and affect are within normal limits.  A focused examination was performed including face, neck, chest and back. Relevant physical exam findings are noted in the Assessment and Plan.  Right Paraspinal Mid Back Hyperpigmented patch      Assessment & Plan  Other eczema Right Paraspinal Mid Back With PIPA = postinflammatory pigment alteration Use Triamcinolone a few times a week as needed for itching/symptoms.  Related Medications triamcinolone cream (KENALOG) 0.1 % Apply 1 Application topically daily. Avoid face, groin, underarms  Return if symptoms worsen or fail to improve. Documentation: I have reviewed the above documentation for accuracy and completeness, and I agree with the above.  Sarina Ser, MD

## 2022-03-31 NOTE — Patient Instructions (Signed)
Use Triamcinolone a few times a week as needed for itching/symptoms.     Topical steroids (such as triamcinolone, fluocinolone, fluocinonide, mometasone, clobetasol, halobetasol, betamethasone, hydrocortisone) can cause thinning and lightening of the skin if they are used for too long in the same area. Your physician has selected the right strength medicine for your problem and area affected on the body. Please use your medication only as directed by your physician to prevent side effects.     Gentle Skin Care Guide  1. Bathe no more than once a day.  2. Avoid bathing in hot water  3. Use a mild soap like Dove, Vanicream, Cetaphil, CeraVe. Can use Lever 2000 or Cetaphil antibacterial soap  4. Use soap only where you need it. On most days, use it under your arms, between your legs, and on your feet. Let the water rinse other areas unless visibly dirty.  5. When you get out of the bath/shower, use a towel to gently blot your skin dry, don't rub it.  6. While your skin is still a little damp, apply a moisturizing cream such as Vanicream, CeraVe, Cetaphil, Eucerin, Sarna lotion or plain Vaseline Jelly. For hands apply Neutrogena [place] Hand Cream or Excipial Hand Cream.  7. Reapply moisturizer any time you start to itch or feel dry.  8. Sometimes using free and clear laundry detergents can be helpful. Fabric softener sheets should be avoided. Downy Free & Gentle liquid, or any liquid fabric softener that is free of dyes and perfumes, it acceptable to use  9. If your doctor has given you prescription creams you may apply moisturizers over them     Due to recent changes in healthcare laws, you may see results of your pathology and/or laboratory studies on MyChart before the doctors have had a chance to review them. We understand that in some cases there may be results that are confusing or concerning to you. Please understand that not all results are received at the same time and often the  doctors may need to interpret multiple results in order to provide you with the best plan of care or course of treatment. Therefore, we ask that you please give [place] 2 business days to thoroughly review all your results before contacting the office for clarification. Should we see a critical lab result, you will be contacted sooner.   If You Need Anything After Your Visit  If you have any questions or concerns for your doctor, please call our main line at [phone] and press option 4 to reach your doctor's medical assistant. If no one answers, please leave a voicemail as directed and we will return your call as soon as possible. Messages left after 4 pm will be answered the following business day.   You may also send [place] a message via [place]. We typically respond to MyChart messages within 1-2 business days.  For prescription refills, please ask your pharmacy to contact our office. Our fax number is [phone].  If you have an urgent issue when the clinic is closed that cannot wait until the next business day, you can page your doctor at the number below.    Please note that while we do our best to be available for urgent issues outside of office hours, we are not available 24/7.   If you have an urgent issue and are unable to reach [place], you may choose to seek medical care at your doctor's office, retail clinic, urgent care center, or emergency room.  If you  have a medical emergency, please immediately call 911 or go to the emergency department.  Pager Numbers  - Dr. Nehemiah Massed: [phone]  - Dr. Laurence Ferrari: [phone]  - Dr. Nicole Kindred: [phone]  In the event of inclement weather, please call our main line at [phone] for an update on the status of any delays or closures.  Dermatology Medication Tips: Please keep the boxes that topical medications come in in order to help keep track of the instructions about where and how to use these. Pharmacies typically print the medication  instructions only on the boxes and not directly on the medication tubes.   If your medication is too expensive, please contact our office at [phone] option 4 or send [place] a message through [place].   We are unable to tell what your co-pay for medications will be in advance as this is different depending on your insurance coverage. However, we may be able to find a substitute medication at lower cost or fill out paperwork to get insurance to cover a needed medication.   If a prior authorization is required to get your medication covered by your insurance company, please allow [place] 1-2 business days to complete this process.  Drug prices often vary depending on where the prescription is filled and some pharmacies may offer cheaper prices.  The website www.goodrx.com contains coupons for medications through different pharmacies. The prices here do not account for what the cost may be with help from insurance (it may be cheaper with your insurance), but the website can give you the price if you did not use any insurance.  - You can print the associated coupon and take it with your prescription to the pharmacy.  - You may also stop by our office during regular business hours and pick up a GoodRx coupon card.  - If you need your prescription sent electronically to a different pharmacy, notify our office through [facility] or by phone at [phone] option 4.     Si Usted Necesita Algo Despus de Su Visita  Tambin puede enviarnos un mensaje a travs de Pharmacist, community. Por lo general respondemos a los mensajes de MyChart en el transcurso de 1 a 2 das hbiles.  Para renovar recetas, por favor pida a su farmacia que se ponga en contacto con nuestra oficina. Har[place] Dingwall de fax es [place] [phone].  Si tiene un asunto urgente cuando la clnica est cerrada y que no puede esperar hasta el siguiente da hbil, puede llamar/localizar a su doctor(a) al nmero que aparece a continuacin.   Por  favor, tenga en cuenta que aunque hacemos todo lo posible para estar disponibles para asuntos urgentes fuera del horario de [place], no estamos disponibles las 24 horas del da, los 7 das de la [place].   Si tiene un problema urgente y no puede comunicarse con nosotros, puede optar por buscar atencin mdica  en el consultorio de su doctor(a), en una clnica privada, en un centro de atencin urgente o en una sala de emergencias.  Si tiene Engineering geologist, por favor llame inmediatamente al 911 o vaya a la sala de emergencias.  Nmeros de bper  - Dr. Nehemiah Massed: [phone]  - Dra. Moye: [phone]  - Dra. Nicole Kindred: [phone]  En caso de inclemencias del [place], por favor llame a Johnsie Kindred principal al [phone] para una actualizacin sobre el [place] de cualquier retraso o cierre.  Consejos para la medicacin en dermatologa: Por favor, guarde las cajas en las que vienen los medicamentos de uso tpico  para ayudarle a seguir las [employer] dnde y cmo usarlos. Las farmacias generalmente imprimen las instrucciones del medicamento slo en las cajas y no directamente en los tubos del [place].   Si su medicamento es muy caro, por favor, pngase en contacto con Zigmund Daniel llamando al [phone] y presione la opcin 4 o envenos un mensaje a travs de Pharmacist, community.   No podemos decirle cul ser su copago por los medicamentos por adelantado ya que esto es diferente dependiendo de la cobertura de su seguro. Sin embargo, es posible que podamos encontrar un medicamento sustituto a Electrical engineer un formulario para que el seguro cubra el medicamento que se considera necesario.   Si se requiere una autorizacin previa para que su compaa de seguros [place] su medicamento, por favor permtanos de 1 a 2 das hbiles para completar este proceso.  Los precios de los medicamentos varan con frecuencia dependiendo del Environmental consultant de dnde se surte la receta y alguna farmacias  pueden ofrecer precios ms baratos.  El sitio web www.goodrx.com tiene cupones para medicamentos de Airline pilot. Los precios aqu no tienen en cuenta lo que podra costar con la ayuda del seguro (puede ser ms barato con su seguro), pero el sitio web puede darle el precio si no utiliz Research scientist (physical sciences).  - Puede imprimir el cupn correspondiente y llevarlo con su receta a la farmacia.  - Tambin puede pasar por nuestra oficina durante el horario de atencin regular y Charity fundraiser una tarjeta de cupones de GoodRx.  - Si necesita que su receta se enve electrnicamente a una farmacia diferente, informe a nuestra oficina a travs de MyChart de [place] o por telfono llamando al [phone] y presione la opcin 4.

## 2022-04-08 ENCOUNTER — Encounter: Payer: Self-pay | Admitting: Dermatology

## 2022-04-21 DIAGNOSIS — I10 Essential (primary) hypertension: Secondary | ICD-10-CM | POA: Diagnosis not present

## 2022-05-22 DIAGNOSIS — I10 Essential (primary) hypertension: Secondary | ICD-10-CM | POA: Diagnosis not present

## 2022-06-22 DIAGNOSIS — I10 Essential (primary) hypertension: Secondary | ICD-10-CM | POA: Diagnosis not present

## 2022-07-22 DIAGNOSIS — I10 Essential (primary) hypertension: Secondary | ICD-10-CM | POA: Diagnosis not present

## 2022-08-21 DIAGNOSIS — I10 Essential (primary) hypertension: Secondary | ICD-10-CM | POA: Diagnosis not present

## 2022-09-05 ENCOUNTER — Other Ambulatory Visit: Payer: Self-pay | Admitting: Internal Medicine

## 2022-09-05 DIAGNOSIS — Z1231 Encounter for screening mammogram for malignant neoplasm of breast: Secondary | ICD-10-CM

## 2022-09-20 DIAGNOSIS — I10 Essential (primary) hypertension: Secondary | ICD-10-CM | POA: Diagnosis not present

## 2022-10-08 ENCOUNTER — Ambulatory Visit
Admission: RE | Admit: 2022-10-08 | Discharge: 2022-10-08 | Disposition: A | Discharge status: Home / Self Care | Payer: BC Managed Care – PPO | Source: Ambulatory Visit | Attending: Internal Medicine | Admitting: Internal Medicine

## 2022-10-08 DIAGNOSIS — Z1231 Encounter for screening mammogram for malignant neoplasm of breast: Secondary | ICD-10-CM | POA: Insufficient documentation

## 2022-10-20 DIAGNOSIS — I10 Essential (primary) hypertension: Secondary | ICD-10-CM | POA: Diagnosis not present

## 2023-02-03 DIAGNOSIS — H35033 Hypertensive retinopathy, bilateral: Secondary | ICD-10-CM | POA: Diagnosis not present

## 2023-02-03 DIAGNOSIS — H2513 Age-related nuclear cataract, bilateral: Secondary | ICD-10-CM | POA: Diagnosis not present

## 2023-02-03 DIAGNOSIS — H43813 Vitreous degeneration, bilateral: Secondary | ICD-10-CM | POA: Diagnosis not present

## 2023-02-03 DIAGNOSIS — I10 Essential (primary) hypertension: Secondary | ICD-10-CM | POA: Diagnosis not present

## 2023-03-05 ENCOUNTER — Encounter: Payer: BC Managed Care – PPO | Admitting: Internal Medicine

## 2023-03-11 ENCOUNTER — Ambulatory Visit: Payer: BC Managed Care – PPO | Admitting: Internal Medicine

## 2023-03-11 ENCOUNTER — Encounter: Payer: Self-pay | Admitting: Internal Medicine

## 2023-03-11 VITALS — BP 122/70 | HR 78 | Temp 98.0°F | Resp 16 | Ht 66.0 in | Wt 147.6 lb

## 2023-03-11 DIAGNOSIS — Z860100 Personal history of colon polyps, unspecified: Secondary | ICD-10-CM

## 2023-03-11 DIAGNOSIS — E7800 Pure hypercholesterolemia, unspecified: Secondary | ICD-10-CM | POA: Diagnosis not present

## 2023-03-11 DIAGNOSIS — F439 Reaction to severe stress, unspecified: Secondary | ICD-10-CM

## 2023-03-11 DIAGNOSIS — L659 Nonscarring hair loss, unspecified: Secondary | ICD-10-CM

## 2023-03-11 DIAGNOSIS — D72819 Decreased white blood cell count, unspecified: Secondary | ICD-10-CM

## 2023-03-11 DIAGNOSIS — Z0000 Encounter for general adult medical examination without abnormal findings: Secondary | ICD-10-CM | POA: Diagnosis not present

## 2023-03-11 DIAGNOSIS — R739 Hyperglycemia, unspecified: Secondary | ICD-10-CM

## 2023-03-11 DIAGNOSIS — Z1211 Encounter for screening for malignant neoplasm of colon: Secondary | ICD-10-CM

## 2023-03-11 DIAGNOSIS — I10 Essential (primary) hypertension: Secondary | ICD-10-CM

## 2023-03-11 DIAGNOSIS — E0789 Other specified disorders of thyroid: Secondary | ICD-10-CM

## 2023-03-11 LAB — HEPATIC FUNCTION PANEL
ALT: 23 U/L (ref 0–35)
AST: 25 U/L (ref 0–37)
Albumin: 4.6 g/dL (ref 3.5–5.2)
Alkaline Phosphatase: 87 U/L (ref 39–117)
Bilirubin, Direct: 0.1 mg/dL (ref 0.0–0.3)
Total Bilirubin: 0.6 mg/dL (ref 0.2–1.2)
Total Protein: 7.7 g/dL (ref 6.0–8.3)

## 2023-03-11 LAB — BASIC METABOLIC PANEL
BUN: 12 mg/dL (ref 6–23)
CO2: 29 meq/L (ref 19–32)
Calcium: 9.4 mg/dL (ref 8.4–10.5)
Chloride: 105 meq/L (ref 96–112)
Creatinine, Ser: 0.92 mg/dL (ref 0.40–1.20)
GFR: 65.18 mL/min (ref >60.00)
Glucose, Bld: 105 mg/dL — ABNORMAL HIGH (ref 70–99)
Potassium: 3.5 meq/L (ref 3.5–5.1)
Sodium: 141 meq/L (ref 135–145)

## 2023-03-11 LAB — HEMOGLOBIN A1C: Hgb A1c MFr Bld: 6.2 % (ref 4.6–6.5)

## 2023-03-11 LAB — TSH: TSH: 1.6 u[IU]/mL (ref 0.35–5.50)

## 2023-03-11 LAB — LIPID PANEL
Cholesterol: 250 mg/dL — ABNORMAL HIGH (ref 0–200)
HDL: 67.2 mg/dL (ref >39.00)
LDL Cholesterol: 161 mg/dL — ABNORMAL HIGH (ref 0–99)
NonHDL: 182.32
Total CHOL/HDL Ratio: 4
Triglycerides: 106 mg/dL (ref 0.0–149.0)
VLDL: 21.2 mg/dL (ref 0.0–40.0)

## 2023-03-11 LAB — CBC WITH DIFFERENTIAL/PLATELET
Basophils Absolute: 0 10*3/uL (ref 0.0–0.1)
Basophils Relative: 0.9 % (ref 0.0–3.0)
Eosinophils Absolute: 0.1 10*3/uL (ref 0.0–0.7)
Eosinophils Relative: 2.7 % (ref 0.0–5.0)
HCT: 40.8 % (ref 36.0–46.0)
Hemoglobin: 13.3 g/dL (ref 12.0–15.0)
Lymphocytes Relative: 38.1 % (ref 12.0–46.0)
Lymphs Abs: 1.3 10*3/uL (ref 0.7–4.0)
MCHC: 32.7 g/dL (ref 30.0–36.0)
MCV: 82 fL (ref 78.0–100.0)
Monocytes Absolute: 0.2 10*3/uL (ref 0.1–1.0)
Monocytes Relative: 7.4 % (ref 3.0–12.0)
Neutro Abs: 1.7 10*3/uL (ref 1.4–7.7)
Neutrophils Relative %: 50.9 % (ref 43.0–77.0)
Platelets: 337 10*3/uL (ref 150.0–400.0)
RBC: 4.97 Mil/uL (ref 3.87–5.11)
RDW: 13.4 % (ref 11.5–15.5)
WBC: 3.3 10*3/uL — ABNORMAL LOW (ref 4.0–10.5)

## 2023-03-11 NOTE — Assessment & Plan Note (Addendum)
Blood pressure doing better.  Has adjusted diet.  On no medication. Continue to monitor blood pressure. Follow metabolic panel.

## 2023-03-11 NOTE — Assessment & Plan Note (Addendum)
Physical today 03/11/23.  PAP 09/03/20 - negative with negative HPV.  Mammogram 10/08/22- Birads I.  Colonoscopy 03/2018 - recommended f/u in 5 years. Due f/u.  Agreeable for referral.

## 2023-03-11 NOTE — Assessment & Plan Note (Addendum)
Has wanted to hold on cholesterol medicatoin.   Low cholesterol diet and exercise.  Follow lipid panel.

## 2023-03-11 NOTE — Progress Notes (Signed)
Subjective:    Patient ID: Cheryl Powers, female    DOB: Jun 14, 1955, 67 y.o.   MRN: [id number]  Patient here for  Chief Complaint  Patient presents with   Annual Exam    HPI Here for a physical exam. Have been following blood pressure.  She has adjusted her diet.  Doing well.  Blood pressure is better.  No chest pain or sob reported.  No abdominal pain or bowel change reported. Increased stress.  Has had a job change.  Handling this change. Overall she feels she is handling things relatively well.   Needs f/u thyroid ultrasound. Discussed.  Agreeable.  Also agreeable for referral to GI - due colonoscopy.   Past Medical History:  Diagnosis Date   Dysrhythmia    PSVT   Hypercholesterolemia    Hypertension    Palpitations    SOBOE (shortness of breath on exertion)    Past Surgical History:  Procedure Laterality Date   CERVICAL POLYP REMOVAL  2003, 2009   COLONOSCOPY  2012   COLONOSCOPY WITH PROPOFOL N/A 04/02/2018   Procedure: COLONOSCOPY WITH PROPOFOL;  Surgeon: Scot Jun, MD;  Location[facility] ENDOSCOPY;  Service: Endoscopy;  Laterality: N/A;   Family History  Problem Relation Age of Onset   Hypertension Father    Stroke Father 39   Heart attack Father    Diabetes Father        TYPE 2   Hyperlipidemia Father    Brain cancer Mother 64       Benign   Breast cancer Neg Hx    Social History   Socioeconomic History   Marital status: Married    Spouse name: Not on file   Number of children: 1   Years of education: Not on file   Highest education level: Not on file  Occupational History   Not on file  Tobacco Use   Smoking status: Never   Smokeless tobacco: Never  Vaping Use   Vaping status: Never Used  Substance and Sexual Activity   Alcohol use: Yes    Alcohol/week: 0.0 standard drinks of alcohol    Comment: glass of wine rarely   Drug use: No   Sexual activity: Yes    Partners: Male    Birth control/protection: Post-menopausal    Comment: Husband    Other Topics Concern   Not on file  Social History Narrative   Lives with husband    Daughter is grown and lives in [place]    Pets: None    Caffeine- No tea/coffee, dark chocolate only and occasionally    Right handed    Enjoys- HGTV   Social Determinants of Health   Financial Resource Strain: Not on file  Food Insecurity: Not on file  Transportation Needs: Not on file  Physical Activity: Not on file  Stress: Not on file  Social Connections: Not on file     Review of Systems  Constitutional:  Negative for appetite change and unexpected weight change.  HENT:  Negative for congestion, sinus pressure and sore throat.   Eyes:  Negative for pain and visual disturbance.  Respiratory:  Negative for cough, chest tightness and shortness of breath.   Cardiovascular:  Negative for chest pain and palpitations.  Gastrointestinal:  Negative for abdominal pain, diarrhea, nausea and vomiting.  Genitourinary:  Negative for difficulty urinating and dysuria.  Musculoskeletal:  Negative for joint swelling and myalgias.  Skin:  Negative for color change and rash.  Neurological:  Negative for  dizziness and headaches.  Hematological:  Negative for adenopathy. Does not bruise/bleed easily.  Psychiatric/Behavioral:  Negative for agitation and dysphoric mood.        Increased stress as outlined.        Objective:     BP 122/70   Pulse 78   Temp 98 F (36.7 C)   Resp 16   Ht 5\' 6"  (1.676 m)   Wt 147 lb 9.6 oz (67 kg)   LMP 07/07/2008 (Approximate)   SpO2 97%   BMI 23.82 kg/m  Wt Readings from Last 3 Encounters:  03/11/23 147 lb 9.6 oz (67 kg)  02/04/22 151 lb (68.5 kg)  06/11/21 153 lb 9.6 oz (69.7 kg)    Physical Exam Vitals reviewed.  Constitutional:      General: She is not in acute distress.    Appearance: Normal appearance. She is well-developed.  HENT:     Head: Normocephalic and atraumatic.     Right Ear: External ear normal.     Left Ear: External ear normal.  Eyes:      General: No scleral icterus.       Right eye: No discharge.        Left eye: No discharge.     Conjunctiva/sclera: Conjunctivae normal.  Neck:     Thyroid: No thyromegaly.     Comments: Increased fullness - thyroid.  Cardiovascular:     Rate and Rhythm: Normal rate and regular rhythm.  Pulmonary:     Effort: No tachypnea, accessory muscle usage or respiratory distress.     Breath sounds: Normal breath sounds. No decreased breath sounds or wheezing.  Chest:  Breasts:    Right: No inverted nipple, mass, nipple discharge or tenderness (no axillary adenopathy).     Left: No inverted nipple, mass, nipple discharge or tenderness (no axilarry adenopathy).  Abdominal:     General: Bowel sounds are normal.     Palpations: Abdomen is soft.     Tenderness: There is no abdominal tenderness.  Musculoskeletal:        General: No swelling or tenderness.     Cervical back: Neck supple. No tenderness.  Lymphadenopathy:     Cervical: No cervical adenopathy.  Skin:    Findings: No erythema or rash.  Neurological:     Mental Status: She is alert and oriented to person, place, and time.  Psychiatric:        Mood and Affect: Mood normal.        Behavior: Behavior normal.      Outpatient Encounter Medications as of 03/11/2023  Medication Sig   Cyanocobalamin (B-12-SL) 1000 MCG SUBL Place under the tongue.   Multiple Vitamins-Minerals (ALIVE MULTI-VITAMIN) CHEW Chew 1 each by mouth daily.   triamcinolone cream (KENALOG) 0.1 % Apply 1 Application topically daily. Avoid face, groin, underarms   TURMERIC PO Take by mouth.   No facility-administered encounter medications on file as of 03/11/2023.     Lab Results  Component Value Date   WBC 3.3 (L) 03/11/2023   HGB 13.3 03/11/2023   HCT 40.8 03/11/2023   PLT 337.0 03/11/2023   GLUCOSE 105 (H) 03/11/2023   CHOL 250 (H) 03/11/2023   TRIG 106.0 03/11/2023   HDL 67.20 03/11/2023   LDLDIRECT 138.3 07/07/2012   LDLCALC 161 (H) 03/11/2023    ALT 23 03/11/2023   AST 25 03/11/2023   NA 141 03/11/2023   K 3.5 03/11/2023   CL 105 03/11/2023   CREATININE 0.92 03/11/2023   BUN  12 03/11/2023   CO2 29 03/11/2023   TSH 1.60 03/11/2023   HGBA1C 6.2 03/11/2023    MM 3D SCREENING MAMMOGRAM BILATERAL BREAST  Result Date: 10/10/2022 CLINICAL DATA:  Screening. EXAM: DIGITAL SCREENING BILATERAL MAMMOGRAM WITH TOMOSYNTHESIS AND CAD TECHNIQUE: Bilateral screening digital craniocaudal and mediolateral oblique mammograms were obtained. Bilateral screening digital breast tomosynthesis was performed. The images were evaluated with computer-aided detection. COMPARISON:  Previous exam(s). ACR Breast Density Category b: There are scattered areas of fibroglandular density. FINDINGS: There are no findings suspicious for malignancy. IMPRESSION: No mammographic evidence of malignancy. A result letter of this screening mammogram will be mailed directly to the patient. RECOMMENDATION: Screening mammogram in one year. (Code:SM-B-01Y) BI-RADS CATEGORY  1: Negative. Electronically Signed   By: Norva Pavlov M.D.   On: 10/10/2022 10:28       Assessment & Plan:  Routine general medical examination at a health care facility  Healthcare maintenance Assessment & Plan: Physical today 03/11/23.  PAP 09/03/20 - negative with negative HPV.  Mammogram 10/08/22- Birads I.  Colonoscopy 03/2018 - recommended f/u in 5 years. Due f/u.  Agreeable for referral.    Essential hypertension, benign Assessment & Plan: Blood pressure doing better.  Has adjusted diet.  On no medication. Continue to monitor blood pressure. Follow metabolic panel.   Orders: -     Basic metabolic panel  Hypercholesterolemia Assessment & Plan: Has wanted to hold on cholesterol medicatoin.   Low cholesterol diet and exercise.  Follow lipid panel.   Orders: -     Hepatic function panel -     TSH -     Lipid panel  Hyperglycemia Assessment & Plan: Low carb diet and exercise. Follow met b  and a1c.   Orders: -     Hemoglobin A1c  Leukopenia, unspecified type Assessment & Plan: Recheck cbc.   Orders: -     CBC with Differential/Platelet  Colon cancer screening -     Ambulatory referral to Gastroenterology  Thyroid fullness Assessment & Plan: Had thyroid ultrasound 10/2020 - multiple nodules as outlined. Given slight increase and one nodule 1.7cm - discussed referral to endocrinology for further evaluation.  Has previously declined.  Discussed today and discussed f/u thyroid ultrasound. Agreeable for f/u thyroid ultrasound.   Orders: -     US THYROID; Future  Stress Assessment & Plan: Increased stress as outlined.  Discussed.  Does not feel needs any further intervention at this time.  Follow.    History of colonic polyps Assessment & Plan: Colonoscopy 03/2018.  Recommended f/u in 5 years. Agreeable for referral to GI.    Hair loss Assessment & Plan: Discussed.  F/u with dermatology.       Dale Lipscomb, MD

## 2023-03-11 NOTE — Assessment & Plan Note (Signed)
Recheck cbc.  

## 2023-03-11 NOTE — Assessment & Plan Note (Signed)
Low carb diet and exercise.  Follow met b and a1c.   

## 2023-03-11 NOTE — Assessment & Plan Note (Signed)
Colonoscopy 03/2018.  Recommended f/u in 5 years. Agreeable for referral to GI.

## 2023-03-11 NOTE — Assessment & Plan Note (Signed)
Discussed.  F/u with dermatology.

## 2023-03-11 NOTE — Assessment & Plan Note (Signed)
Increased stress as outlined.  Discussed.  Does not feel needs any further intervention at this time.  Follow.  

## 2023-03-11 NOTE — Assessment & Plan Note (Signed)
Had thyroid ultrasound 10/2020 - multiple nodules as outlined. Given slight increase and one nodule 1.7cm - discussed referral to endocrinology for further evaluation.  Has previously declined.  Discussed today and discussed f/u thyroid ultrasound. Agreeable for f/u thyroid ultrasound.

## 2023-03-23 ENCOUNTER — Ambulatory Visit: Payer: BC Managed Care – PPO | Attending: Internal Medicine

## 2023-04-05 IMAGING — MG MM DIGITAL SCREENING BILAT W/ TOMO AND CAD
8 series · 8 of 24 positions shown · non-contrast
Comparison: Previous exam(s).

CLINICAL DATA: Screening.

EXAM:
DIGITAL SCREENING BILATERAL MAMMOGRAM WITH TOMOSYNTHESIS AND CAD
TECHNIQUE: Bilateral screening digital craniocaudal and mediolateral oblique
mammograms were obtained. Bilateral screening digital breast
tomosynthesis was performed. The images were evaluated with
computer-aided detection.

[R MLO synth-2D]
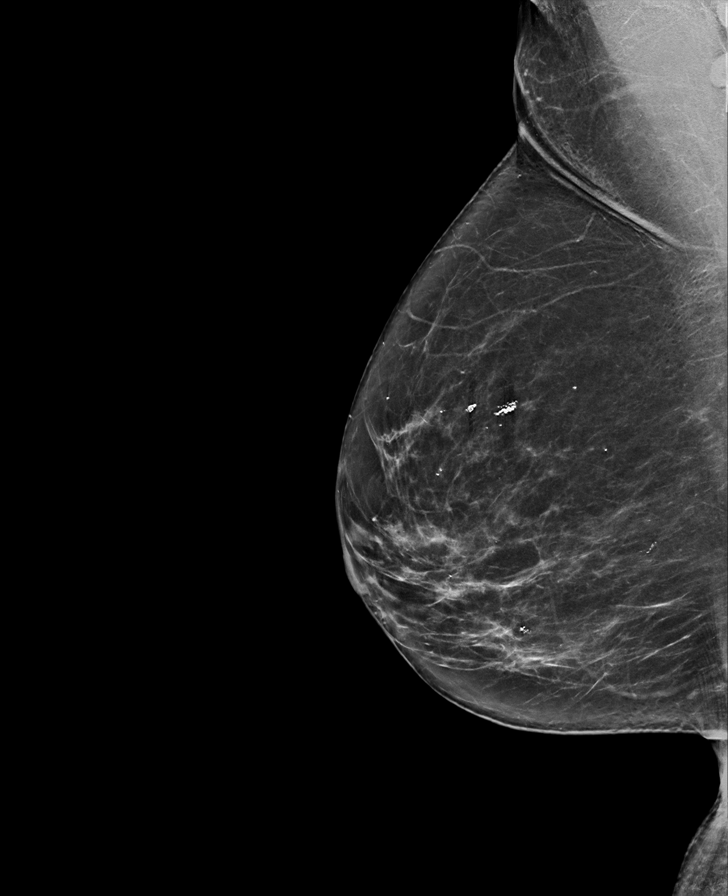

[R CC synth-2D]
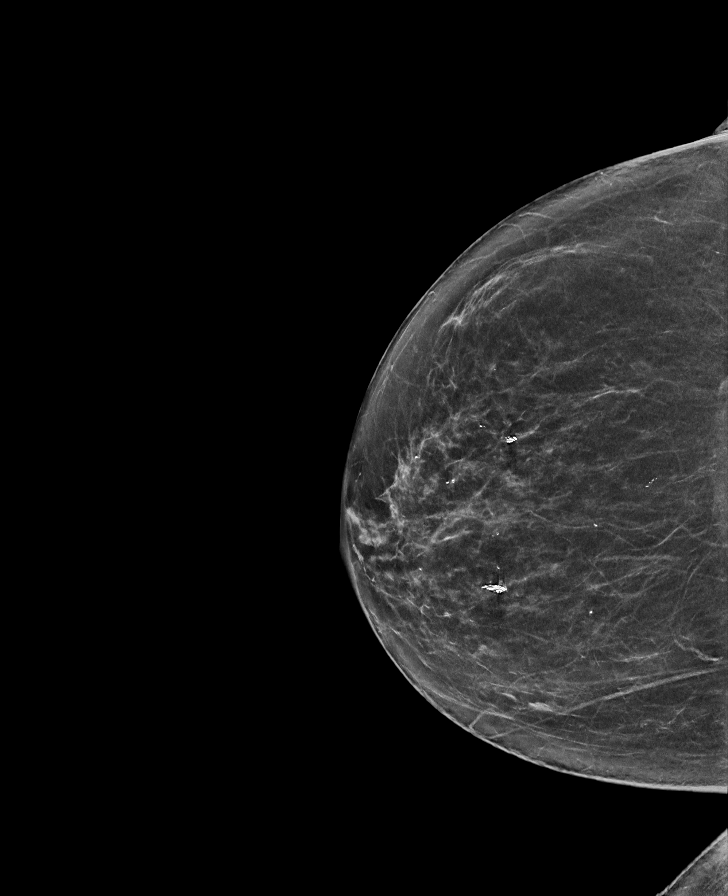

[L MLO synth-2D]
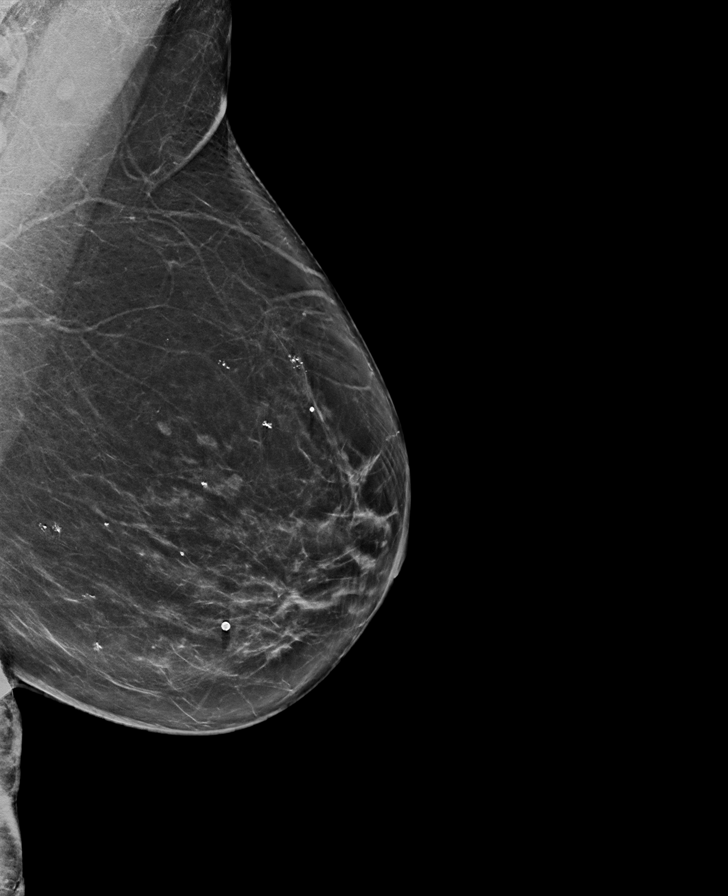

[L CC synth-2D]
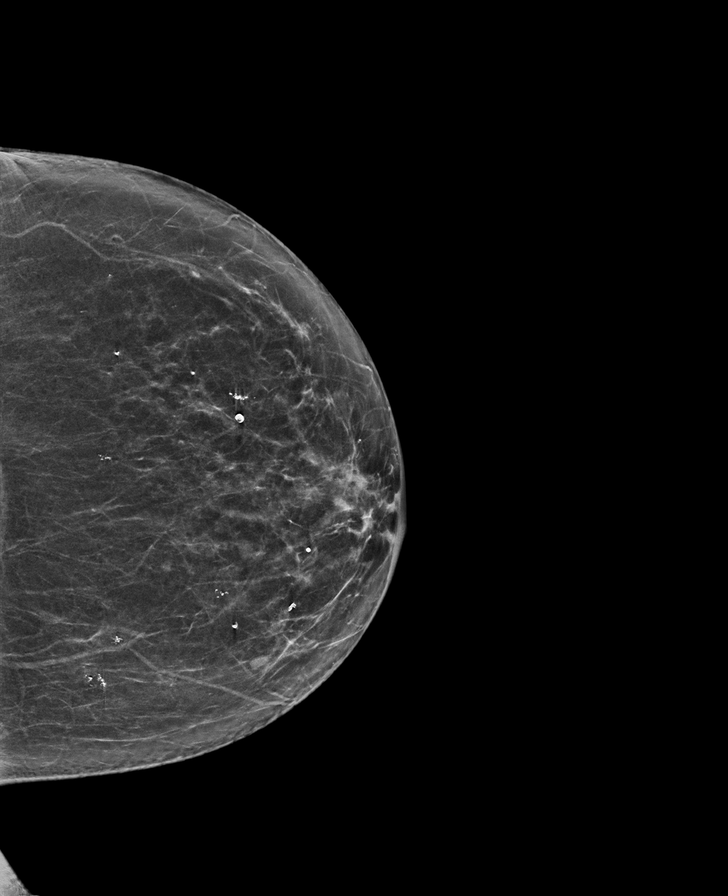

[R CC tomo · tomo slice 36/71.0]
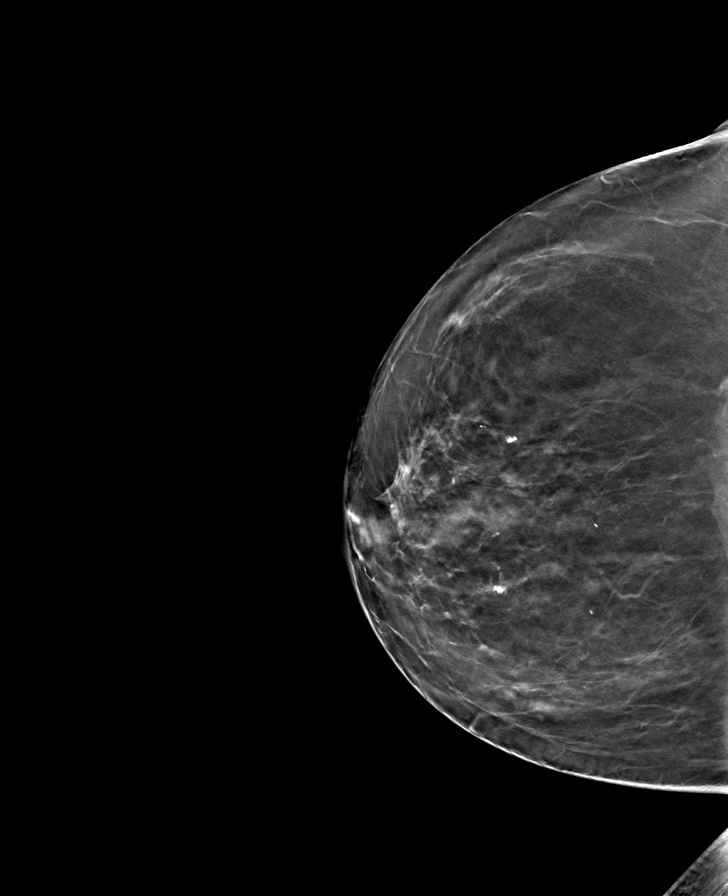

[L MLO tomo · tomo slice 42/83.0]
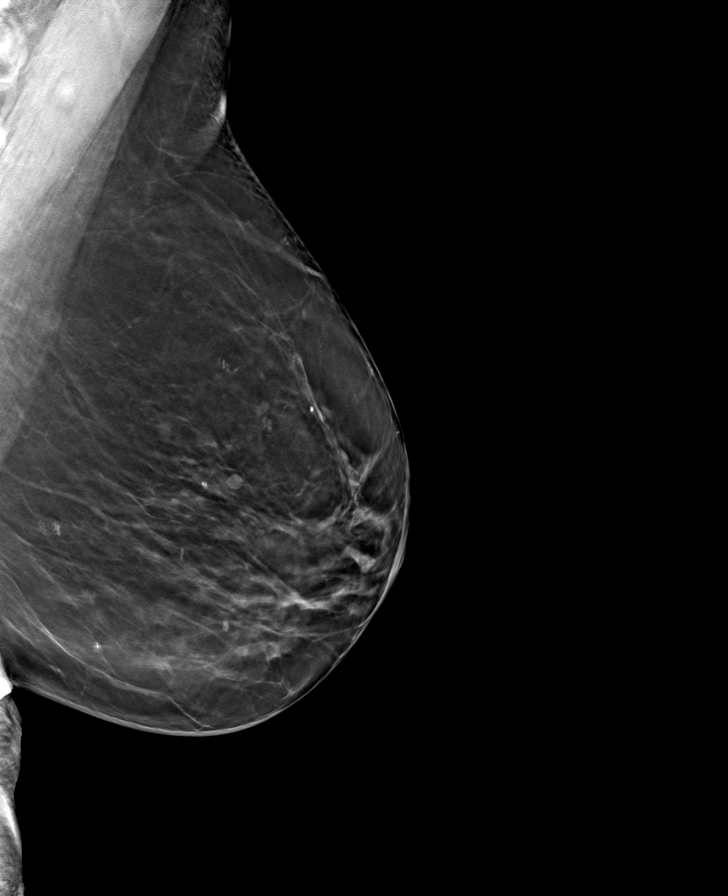

[L CC tomo · tomo slice 37/74.0]
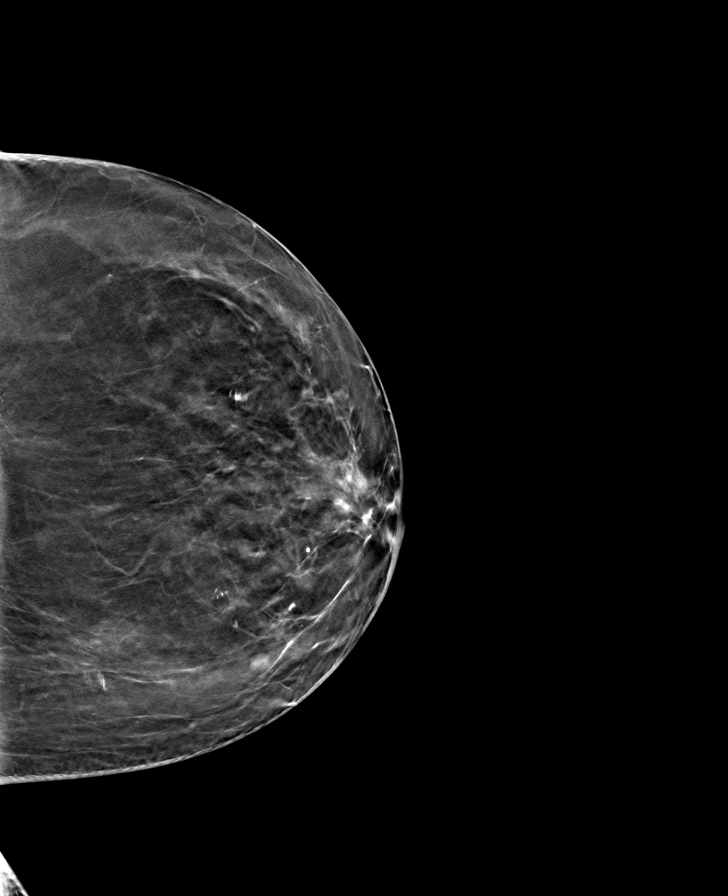

[R MLO tomo · tomo slice 39/78.0]
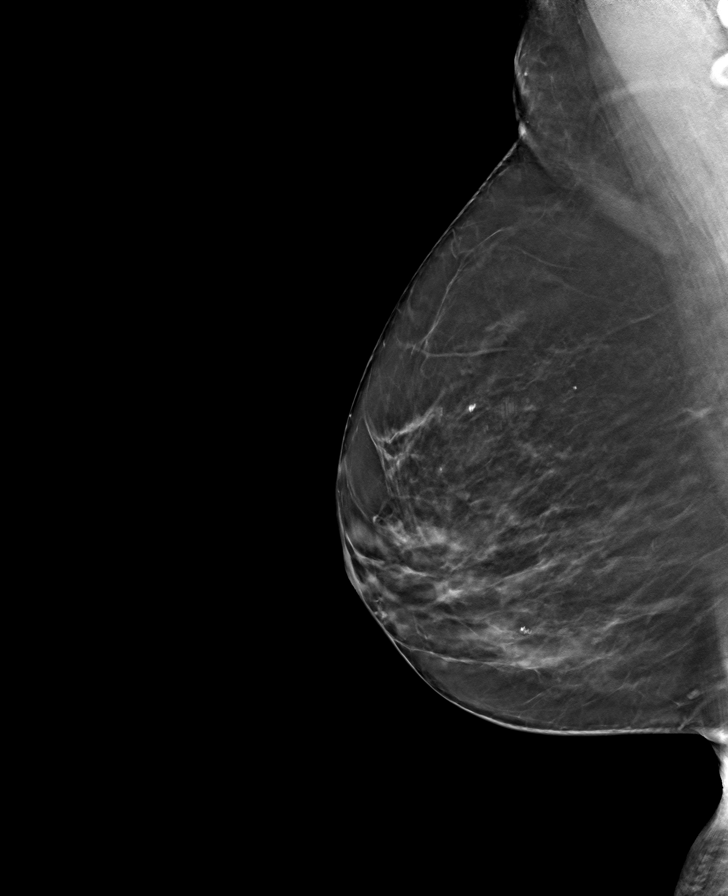

[8 of 24 positions shown; findings below may reference images not displayed]

ACR Breast Density Category b: There are scattered areas of
fibroglandular density.
FINDINGS: There are no findings suspicious for malignancy. The images were
evaluated with computer-aided detection.
IMPRESSION: No mammographic evidence of malignancy. A result letter of this
screening mammogram will be mailed directly to the patient.

RECOMMENDATION:
Screening mammogram in one year. (Code:WJ-I-BG6)

BI-RADS CATEGORY  1: Negative.

## 2023-06-25 DIAGNOSIS — Z83719 Family history of colon polyps, unspecified: Secondary | ICD-10-CM | POA: Diagnosis not present

## 2023-06-25 DIAGNOSIS — Z1211 Encounter for screening for malignant neoplasm of colon: Secondary | ICD-10-CM | POA: Diagnosis not present

## 2023-09-08 ENCOUNTER — Ambulatory Visit: Payer: BC Managed Care – PPO | Admitting: Internal Medicine

## 2023-09-08 ENCOUNTER — Other Ambulatory Visit: Payer: Self-pay | Admitting: Internal Medicine

## 2023-09-08 DIAGNOSIS — Z1231 Encounter for screening mammogram for malignant neoplasm of breast: Secondary | ICD-10-CM

## 2023-09-30 ENCOUNTER — Encounter: Admission: RE | Payer: Self-pay | Source: Home / Self Care

## 2023-09-30 ENCOUNTER — Ambulatory Visit: Admission: RE | Admit: 2023-09-30 | Source: Home / Self Care | Admitting: Internal Medicine

## 2023-09-30 SURGERY — COLONOSCOPY
Anesthesia: General

## 2023-10-13 ENCOUNTER — Ambulatory Visit
Admission: RE | Admit: 2023-10-13 | Discharge: 2023-10-13 | Disposition: A | Discharge status: Home / Self Care | Source: Ambulatory Visit | Attending: Internal Medicine | Admitting: Internal Medicine

## 2023-10-13 DIAGNOSIS — Z1231 Encounter for screening mammogram for malignant neoplasm of breast: Secondary | ICD-10-CM | POA: Diagnosis not present

## 2023-10-26 ENCOUNTER — Encounter: Payer: Self-pay | Admitting: Internal Medicine

## 2023-10-26 ENCOUNTER — Ambulatory Visit (INDEPENDENT_AMBULATORY_CARE_PROVIDER_SITE_OTHER): Admitting: Internal Medicine

## 2023-10-26 ENCOUNTER — Other Ambulatory Visit: Payer: Self-pay

## 2023-10-26 DIAGNOSIS — E7800 Pure hypercholesterolemia, unspecified: Secondary | ICD-10-CM

## 2023-10-26 DIAGNOSIS — E538 Deficiency of other specified B group vitamins: Secondary | ICD-10-CM

## 2023-10-26 DIAGNOSIS — I10 Essential (primary) hypertension: Secondary | ICD-10-CM

## 2023-10-26 DIAGNOSIS — E0789 Other specified disorders of thyroid: Secondary | ICD-10-CM

## 2023-10-26 DIAGNOSIS — R739 Hyperglycemia, unspecified: Secondary | ICD-10-CM

## 2023-10-26 NOTE — Progress Notes (Signed)
 Patient ID: Cheryl Powers, female   DOB: 07-Sep-1955, 68 y.o.   MRN: [id number] Did not show for appt.

## 2023-10-26 NOTE — Progress Notes (Deleted)
 Subjective:    Patient ID: Cheryl Powers, female    DOB: Jun 21, 1955, 68 y.o.   MRN: [id number]  Patient here for No chief complaint on file.   HPI Here for a scheduled follow up - follow up regarding hypertension, hyperglycemia and hypercholesterolemia. Last visit, was referred for colonoscopy. Was scheduled for 09/30/23 - canceled. Also has not had the thyroid  ultrasound ordered last visit.    Past Medical History:  Diagnosis Date   Dysrhythmia    PSVT   Hypercholesterolemia    Hypertension    Palpitations    SOBOE (shortness of breath on exertion)    Past Surgical History:  Procedure Laterality Date   CERVICAL POLYP REMOVAL  2003, 2009   COLONOSCOPY  2012   COLONOSCOPY WITH PROPOFOL  N/A 04/02/2018   Procedure: COLONOSCOPY WITH PROPOFOL ;  Surgeon: Viktoria Lamar DASEN, MD;  Location: [facility] ENDOSCOPY;  Service: Endoscopy;  Laterality: N/A;   Family History  Problem Relation Age of Onset   Hypertension Father    Stroke Father 52   Heart attack Father    Diabetes Father        TYPE 2   Hyperlipidemia Father    Brain cancer Mother 59       Benign   Breast cancer Neg Hx    Social History   Socioeconomic History   Marital status: Married    Spouse name: Not on file   Number of children: 1   Years of education: Not on file   Highest education level: Not on file  Occupational History   Not on file  Tobacco Use   Smoking status: Never   Smokeless tobacco: Never  Vaping Use   Vaping status: Never Used  Substance and Sexual Activity   Alcohol use: Yes    Alcohol/week: 0.0 standard drinks of alcohol    Comment: glass of wine rarely   Drug use: No   Sexual activity: Yes    Partners: Male    Birth control/protection: Post-menopausal    Comment: Husband   Other Topics Concern   Not on file  Social History Narrative   Lives with husband    Daughter is grown and lives in [place]     Pets: None    Caffeine- No tea/coffee, dark chocolate only and occasionally    Right  handed    Enjoys- [employer]   Social Drivers of Health   Financial Resource Strain: Not on file  Food Insecurity: Not on file  Transportation Needs: Not on file  Physical Activity: Not on file  Stress: Not on file  Social Connections: Not on file     Review of Systems     Objective:     LMP 07/07/2008 (Approximate)  Wt Readings from Last 3 Encounters:  03/11/23 147 lb 9.6 oz (67 kg)  02/04/22 151 lb (68.5 kg)  06/11/21 153 lb 9.6 oz (69.7 kg)    Physical Exam  {Perform Simple Foot Exam  Perform Detailed exam:1} {Insert foot Exam (Optional):30965}   Outpatient Encounter Medications as of 10/26/2023  Medication Sig   Cyanocobalamin  (B-12-SL) 1000 MCG SUBL Place under the tongue.   Multiple Vitamins-Minerals (ALIVE MULTI-VITAMIN) CHEW Chew 1 each by mouth daily.   triamcinolone  cream (KENALOG ) 0.1 % Apply 1 Application topically daily. Avoid face, groin, underarms   TURMERIC PO Take by mouth.   No facility-administered encounter medications on file as of 10/26/2023.     Lab Results  Component Value Date   WBC 3.3 (L) 03/11/2023  HGB 13.3 03/11/2023   HCT 40.8 03/11/2023   PLT 337.0 03/11/2023   GLUCOSE 105 (H) 03/11/2023   CHOL 250 (H) 03/11/2023   TRIG 106.0 03/11/2023   HDL 67.20 03/11/2023   LDLDIRECT 138.3 07/07/2012   LDLCALC 161 (H) 03/11/2023   ALT 23 03/11/2023   AST 25 03/11/2023   NA 141 03/11/2023   K 3.5 03/11/2023   CL 105 03/11/2023   CREATININE 0.92 03/11/2023   BUN 12 03/11/2023   CO2 29 03/11/2023   TSH 1.60 03/11/2023   HGBA1C 6.2 03/11/2023    MM 3D SCREENING MAMMOGRAM BILATERAL BREAST Result Date: 10/15/2023 CLINICAL DATA:  Screening. EXAM: DIGITAL SCREENING BILATERAL MAMMOGRAM WITH TOMOSYNTHESIS AND CAD TECHNIQUE: Bilateral screening digital craniocaudal and mediolateral oblique mammograms were obtained. Bilateral screening digital breast tomosynthesis was performed. The images were evaluated with computer-aided detection. COMPARISON:   Previous exam(s). ACR Breast Density Category b: There are scattered areas of fibroglandular density. FINDINGS: There are no findings suspicious for malignancy. IMPRESSION: No mammographic evidence of malignancy. A result letter of this screening mammogram will be mailed directly to the patient. RECOMMENDATION: Screening mammogram in one year. (Code:SM-B-01Y) BI-RADS CATEGORY  1: Negative. Electronically Signed   By: Toribio Agreste M.D.   On: 10/15/2023 11:39       Assessment & Plan:  There are no diagnoses linked to this encounter.   Allena Hamilton, MD

## 2023-10-26 NOTE — Assessment & Plan Note (Signed)
 Here for a scheduled follow up - follow up regarding hypertension, hyperglycemia and hypercholesterolemia. Last visit, was referred for colonoscopy. Was scheduled for 09/30/23 - canceled. Also has not had the thyroid  ultrasound ordered last visit.

## 2023-11-17 ENCOUNTER — Ambulatory Visit: Admitting: Internal Medicine

## 2023-11-17 VITALS — BP 122/70 | HR 87 | Resp 16 | Ht 66.0 in | Wt 154.4 lb

## 2023-11-17 DIAGNOSIS — D72819 Decreased white blood cell count, unspecified: Secondary | ICD-10-CM

## 2023-11-17 DIAGNOSIS — R739 Hyperglycemia, unspecified: Secondary | ICD-10-CM | POA: Diagnosis not present

## 2023-11-17 DIAGNOSIS — F439 Reaction to severe stress, unspecified: Secondary | ICD-10-CM | POA: Diagnosis not present

## 2023-11-17 DIAGNOSIS — Z860100 Personal history of colon polyps, unspecified: Secondary | ICD-10-CM

## 2023-11-17 DIAGNOSIS — I10 Essential (primary) hypertension: Secondary | ICD-10-CM

## 2023-11-17 DIAGNOSIS — E0789 Other specified disorders of thyroid: Secondary | ICD-10-CM | POA: Diagnosis not present

## 2023-11-17 DIAGNOSIS — E7800 Pure hypercholesterolemia, unspecified: Secondary | ICD-10-CM

## 2023-11-17 NOTE — Progress Notes (Signed)
 Subjective:    Patient ID: Cheryl Powers, female    DOB: 1956-03-07, 68 y.o.   MRN: [id number]  Patient here for  Chief Complaint  Patient presents with   Medical Management of Chronic Issues    HPI Here for a scheduled follow up - follow up regarding hypertension and hypercholesterolemia. Last visit, reported increased stress. Still with increased stress. Discussed. She will notify me if she feels she needs further interventtion. Overdue f/u colonoscopy. Was referred to GI last visit. She canceled her colonoscopy. Discussed today regarding rescheduling. Also ordered thyroid  ultrasound. Plans to schedule. No chest pain. Breathing stable. No sob. Watching her diet. Discussed dermatology referral. She will let me know.    Past Medical History:  Diagnosis Date   Dysrhythmia    PSVT   Hypercholesterolemia    Hypertension    Palpitations    SOBOE (shortness of breath on exertion)    Past Surgical History:  Procedure Laterality Date   CERVICAL POLYP REMOVAL  2003, 2009   COLONOSCOPY  2012   COLONOSCOPY WITH PROPOFOL  N/A 04/02/2018   Procedure: COLONOSCOPY WITH PROPOFOL ;  Surgeon: Viktoria Lamar DASEN, MD;  Location: [facility] ENDOSCOPY;  Service: Endoscopy;  Laterality: N/A;   Family History  Problem Relation Age of Onset   Hypertension Father    Stroke Father 69   Heart attack Father    Diabetes Father        TYPE 2   Hyperlipidemia Father    Brain cancer Mother 53       Benign   Breast cancer Neg Hx    Social History   Socioeconomic History   Marital status: Married    Spouse name: Not on file   Number of children: 1   Years of education: Not on file   Highest education level: Not on file  Occupational History   Not on file  Tobacco Use   Smoking status: Never   Smokeless tobacco: Never  Vaping Use   Vaping status: Never Used  Substance and Sexual Activity   Alcohol use: Yes    Alcohol/week: 0.0 standard drinks of alcohol    Comment: glass of wine rarely   Drug  use: No   Sexual activity: Yes    Partners: Male    Birth control/protection: Post-menopausal    Comment: Husband   Other Topics Concern   Not on file  Social History Narrative   Lives with husband    Daughter is grown and lives in [place]     Pets: None    Caffeine- No tea/coffee, dark chocolate only and occasionally    Right handed    Enjoys- [employer]   Social Drivers of Health   Financial Resource Strain: Not on file  Food Insecurity: Not on file  Transportation Needs: Not on file  Physical Activity: Not on file  Stress: Not on file  Social Connections: Not on file     Review of Systems  Constitutional:  Negative for appetite change and unexpected weight change.  HENT:  Negative for congestion and sinus pressure.   Respiratory:  Negative for cough, chest tightness and shortness of breath.   Cardiovascular:  Negative for chest pain, palpitations and leg swelling.  Gastrointestinal:  Negative for abdominal pain, diarrhea, nausea and vomiting.  Genitourinary:  Negative for difficulty urinating and dysuria.  Musculoskeletal:  Negative for joint swelling and myalgias.  Skin:  Negative for color change and rash.  Neurological:  Negative for dizziness and headaches.  Psychiatric/Behavioral:  Negative for  agitation and dysphoric mood.        Increased stress.        Objective:     BP 122/70   Pulse 87   Resp 16   Ht 5' 6 (1.676 m)   Wt 154 lb 6.4 oz (70 kg)   LMP 07/07/2008 (Approximate)   SpO2 99%   BMI 24.92 kg/m  Wt Readings from Last 3 Encounters:  11/17/23 154 lb 6.4 oz (70 kg)  03/11/23 147 lb 9.6 oz (67 kg)  02/04/22 151 lb (68.5 kg)    Physical Exam Vitals reviewed.  Constitutional:      General: She is not in acute distress.    Appearance: Normal appearance.  HENT:     Head: Normocephalic and atraumatic.     Right Ear: External ear normal.     Left Ear: External ear normal.     Mouth/Throat:     Pharynx: No oropharyngeal exudate or posterior  oropharyngeal erythema.  Eyes:     General: No scleral icterus.       Right eye: No discharge.        Left eye: No discharge.     Conjunctiva/sclera: Conjunctivae normal.  Neck:     Thyroid : No thyromegaly.  Cardiovascular:     Rate and Rhythm: Normal rate and regular rhythm.  Pulmonary:     Effort: No respiratory distress.     Breath sounds: Normal breath sounds. No wheezing.  Abdominal:     General: Bowel sounds are normal.     Palpations: Abdomen is soft.     Tenderness: There is no abdominal tenderness.  Musculoskeletal:        General: No swelling or tenderness.     Cervical back: Neck supple. No tenderness.  Lymphadenopathy:     Cervical: No cervical adenopathy.  Skin:    Findings: No erythema or rash.  Neurological:     Mental Status: She is alert.  Psychiatric:        Mood and Affect: Mood normal.        Behavior: Behavior normal.         Outpatient Encounter Medications as of 11/17/2023  Medication Sig   Cyanocobalamin  (B-12-SL) 1000 MCG SUBL Place under the tongue.   Multiple Vitamins-Minerals (ALIVE MULTI-VITAMIN) CHEW Chew 1 each by mouth daily.   triamcinolone  cream (KENALOG ) 0.1 % Apply 1 Application topically daily. Avoid face, groin, underarms   TURMERIC PO Take by mouth.   No facility-administered encounter medications on file as of 11/17/2023.     Lab Results  Component Value Date   WBC 3.3 (L) 03/11/2023   HGB 13.3 03/11/2023   HCT 40.8 03/11/2023   PLT 337.0 03/11/2023   GLUCOSE 105 (H) 03/11/2023   CHOL 250 (H) 03/11/2023   TRIG 106.0 03/11/2023   HDL 67.20 03/11/2023   LDLDIRECT 138.3 07/07/2012   LDLCALC 161 (H) 03/11/2023   ALT 23 03/11/2023   AST 25 03/11/2023   NA 141 03/11/2023   K 3.5 03/11/2023   CL 105 03/11/2023   CREATININE 0.92 03/11/2023   BUN 12 03/11/2023   CO2 29 03/11/2023   TSH 1.60 03/11/2023   HGBA1C 6.2 03/11/2023    MM 3D SCREENING MAMMOGRAM BILATERAL BREAST Result Date: 10/15/2023 CLINICAL DATA:   Screening. EXAM: DIGITAL SCREENING BILATERAL MAMMOGRAM WITH TOMOSYNTHESIS AND CAD TECHNIQUE: Bilateral screening digital craniocaudal and mediolateral oblique mammograms were obtained. Bilateral screening digital breast tomosynthesis was performed. The images were evaluated with computer-aided detection. COMPARISON:  Previous  exam(s). ACR Breast Density Category b: There are scattered areas of fibroglandular density. FINDINGS: There are no findings suspicious for malignancy. IMPRESSION: No mammographic evidence of malignancy. A result letter of this screening mammogram will be mailed directly to the patient. RECOMMENDATION: Screening mammogram in one year. (Code:SM-B-01Y) BI-RADS CATEGORY  1: Negative. Electronically Signed   By: Toribio Agreste M.D.   On: 10/15/2023 11:39       Assessment & Plan:  Thyroid  fullness Assessment & Plan: Had thyroid  ultrasound 10/2020 - multiple nodules as outlined. Given slight increase and one nodule 1.7cm - discussed referral to endocrinology for further evaluation.  Has previously declined.  Discussed today and discussed f/u thyroid  ultrasound. Was ordered last visit. Needs f/u. Will schedule.    Stress Assessment & Plan: Increased stress. Discussed. Will notify me if she feels she needs further intervention. Follow.    Leukopenia, unspecified type Assessment & Plan: Last wbc count 3.3. recheck cbc with next labs.    Hyperglycemia Assessment & Plan: Low carb diet and exercise. Follow met b and A1c.    Hypercholesterolemia Assessment & Plan: Low cholesterol diet and exercise. Check lipid panel with next fasting labs.   Lab Results  Component Value Date   CHOL 250 (H) 03/11/2023   HDL 67.20 03/11/2023   LDLCALC 161 (H) 03/11/2023   LDLDIRECT 138.3 07/07/2012   TRIG 106.0 03/11/2023   CHOLHDL 4 03/11/2023      History of colonic polyps Assessment & Plan: Colonoscopy 03/2018.  Recommended f/u in 5 years. Referred to GI previous visit. Canceled  colonoscopy. Discussed today regarding rescheduling. Agreeable. She will call to reschedule.    Essential hypertension, benign Assessment & Plan: Blood pressure overall doing better. She has adjusted her diet. Is on no medication and request to not take medication. Follow pressures.       Allena Hamilton, MD

## 2023-11-22 ENCOUNTER — Encounter: Payer: Self-pay | Admitting: Internal Medicine

## 2023-11-22 NOTE — Assessment & Plan Note (Signed)
 Low cholesterol diet and exercise. Check lipid panel with next fasting labs.   Lab Results  Component Value Date   CHOL 250 (H) 03/11/2023   HDL 67.20 03/11/2023   LDLCALC 161 (H) 03/11/2023   LDLDIRECT 138.3 07/07/2012   TRIG 106.0 03/11/2023   CHOLHDL 4 03/11/2023

## 2023-11-22 NOTE — Assessment & Plan Note (Signed)
 Colonoscopy 03/2018.  Recommended f/u in 5 years. Referred to GI previous visit. Canceled colonoscopy. Discussed today regarding rescheduling. Agreeable. She will call to reschedule.

## 2023-11-22 NOTE — Assessment & Plan Note (Signed)
 Blood pressure overall doing better. She has adjusted her diet. Is on no medication and request to not take medication. Follow pressures.

## 2023-11-22 NOTE — Assessment & Plan Note (Signed)
 Had thyroid  ultrasound 10/2020 - multiple nodules as outlined. Given slight increase and one nodule 1.7cm - discussed referral to endocrinology for further evaluation.  Has previously declined.  Discussed today and discussed f/u thyroid  ultrasound. Was ordered last visit. Needs f/u. Will schedule.

## 2023-11-22 NOTE — Assessment & Plan Note (Signed)
 Low-carb diet and exercise.  Follow met b and A1c.

## 2023-11-22 NOTE — Assessment & Plan Note (Signed)
 Last wbc count 3.3. recheck cbc with next labs.

## 2023-11-22 NOTE — Assessment & Plan Note (Signed)
 Increased stress. Discussed. Will notify me if she feels she needs further intervention. Follow.

## 2023-11-23 ENCOUNTER — Telehealth: Payer: Self-pay | Admitting: Internal Medicine

## 2023-11-23 DIAGNOSIS — L989 Disorder of the skin and subcutaneous tissue, unspecified: Secondary | ICD-10-CM

## 2023-11-23 NOTE — Telephone Encounter (Signed)
 Referral pended below (could not find the reason for referral in office note)

## 2023-11-23 NOTE — Telephone Encounter (Signed)
 Copied from CRM [id number]. Topic: Referral - Request for Referral >> Nov 23, 2023 12:51 PM Suzen RAMAN wrote: Did the patient discuss referral with their provider in the last year? Yes (If No - schedule appointment) (If Yes - send message)  Appointment offered? No  Type of order/referral and detailed reason for visit: Dermatology   Preference of office, provider, location: Delon Lenis, DO  Dermatologist in [place], [place] [address],  [place], [place] [address]  [address] [phone]  If referral order, have you been seen by this specialty before? No (If Yes, this issue or another issue? When? Where?  Can we respond through MyChart? Yes

## 2023-11-25 NOTE — Telephone Encounter (Signed)
Order placed for dermatology referral.  

## 2023-12-10 ENCOUNTER — Other Ambulatory Visit (INDEPENDENT_AMBULATORY_CARE_PROVIDER_SITE_OTHER)

## 2023-12-10 DIAGNOSIS — E538 Deficiency of other specified B group vitamins: Secondary | ICD-10-CM | POA: Diagnosis not present

## 2023-12-10 DIAGNOSIS — E7800 Pure hypercholesterolemia, unspecified: Secondary | ICD-10-CM | POA: Diagnosis not present

## 2023-12-10 DIAGNOSIS — R739 Hyperglycemia, unspecified: Secondary | ICD-10-CM

## 2023-12-10 DIAGNOSIS — E0789 Other specified disorders of thyroid: Secondary | ICD-10-CM | POA: Diagnosis not present

## 2023-12-10 DIAGNOSIS — I10 Essential (primary) hypertension: Secondary | ICD-10-CM

## 2023-12-10 LAB — CBC WITH DIFFERENTIAL/PLATELET
Basophils Absolute: 0 K/uL (ref 0.0–0.1)
Basophils Relative: 1 % (ref 0.0–3.0)
Eosinophils Absolute: 0.1 K/uL (ref 0.0–0.7)
Eosinophils Relative: 3.7 % (ref 0.0–5.0)
HCT: 39.9 % (ref 36.0–46.0)
Hemoglobin: 13.3 g/dL (ref 12.0–15.0)
Lymphocytes Relative: 42.3 % (ref 12.0–46.0)
Lymphs Abs: 1.5 K/uL (ref 0.7–4.0)
MCHC: 33.3 g/dL (ref 30.0–36.0)
MCV: 79.5 fl (ref 78.0–100.0)
Monocytes Absolute: 0.3 K/uL (ref 0.1–1.0)
Monocytes Relative: 7.8 % (ref 3.0–12.0)
Neutro Abs: 1.6 K/uL (ref 1.4–7.7)
Neutrophils Relative %: 45.2 % (ref 43.0–77.0)
Platelets: 314 K/uL (ref 150.0–400.0)
RBC: 5.02 Mil/uL (ref 3.87–5.11)
RDW: 13.1 % (ref 11.5–15.5)
WBC: 3.6 K/uL — ABNORMAL LOW (ref 4.0–10.5)

## 2023-12-10 LAB — HEPATIC FUNCTION PANEL
ALT: 17 U/L (ref 0–35)
AST: 19 U/L (ref 0–37)
Albumin: 4.4 g/dL (ref 3.5–5.2)
Alkaline Phosphatase: 87 U/L (ref 39–117)
Bilirubin, Direct: 0.1 mg/dL (ref 0.0–0.3)
Total Bilirubin: 0.4 mg/dL (ref 0.2–1.2)
Total Protein: 7.3 g/dL (ref 6.0–8.3)

## 2023-12-10 LAB — LIPID PANEL
Cholesterol: 215 mg/dL — ABNORMAL HIGH (ref 0–200)
HDL: 58.3 mg/dL (ref >39.00)
LDL Cholesterol: 126 mg/dL — ABNORMAL HIGH (ref 0–99)
NonHDL: 156.91
Total CHOL/HDL Ratio: 4
Triglycerides: 157 mg/dL — ABNORMAL HIGH (ref 0.0–149.0)
VLDL: 31.4 mg/dL (ref 0.0–40.0)

## 2023-12-10 LAB — TSH: TSH: 1.44 u[IU]/mL (ref 0.35–5.50)

## 2023-12-10 LAB — BASIC METABOLIC PANEL WITH GFR
BUN: 15 mg/dL (ref 6–23)
CO2: 30 meq/L (ref 19–32)
Calcium: 9.3 mg/dL (ref 8.4–10.5)
Chloride: 102 meq/L (ref 96–112)
Creatinine, Ser: 1.01 mg/dL (ref 0.40–1.20)
GFR: 57.97 mL/min — ABNORMAL LOW (ref >60.00)
Glucose, Bld: 107 mg/dL — ABNORMAL HIGH (ref 70–99)
Potassium: 3.9 meq/L (ref 3.5–5.1)
Sodium: 139 meq/L (ref 135–145)

## 2023-12-10 LAB — HEMOGLOBIN A1C: Hgb A1c MFr Bld: 6.6 % — ABNORMAL HIGH (ref 4.6–6.5)

## 2023-12-11 ENCOUNTER — Ambulatory Visit: Payer: Self-pay | Admitting: Internal Medicine

## 2023-12-14 ENCOUNTER — Other Ambulatory Visit: Payer: Self-pay

## 2023-12-14 DIAGNOSIS — I10 Essential (primary) hypertension: Secondary | ICD-10-CM

## 2023-12-14 DIAGNOSIS — R739 Hyperglycemia, unspecified: Secondary | ICD-10-CM

## 2023-12-14 DIAGNOSIS — E7800 Pure hypercholesterolemia, unspecified: Secondary | ICD-10-CM

## 2023-12-21 DIAGNOSIS — I10 Essential (primary) hypertension: Secondary | ICD-10-CM | POA: Diagnosis not present

## 2023-12-31 ENCOUNTER — Other Ambulatory Visit (INDEPENDENT_AMBULATORY_CARE_PROVIDER_SITE_OTHER)

## 2023-12-31 ENCOUNTER — Ambulatory Visit: Payer: Self-pay | Admitting: Internal Medicine

## 2023-12-31 DIAGNOSIS — I10 Essential (primary) hypertension: Secondary | ICD-10-CM | POA: Diagnosis not present

## 2023-12-31 DIAGNOSIS — R739 Hyperglycemia, unspecified: Secondary | ICD-10-CM

## 2023-12-31 DIAGNOSIS — E7800 Pure hypercholesterolemia, unspecified: Secondary | ICD-10-CM

## 2023-12-31 LAB — BASIC METABOLIC PANEL WITH GFR
BUN: 15 mg/dL (ref 6–23)
CO2: 28 meq/L (ref 19–32)
Calcium: 9.1 mg/dL (ref 8.4–10.5)
Chloride: 104 meq/L (ref 96–112)
Creatinine, Ser: 1.02 mg/dL (ref 0.40–1.20)
GFR: 57.26 mL/min — ABNORMAL LOW (ref >60.00)
Glucose, Bld: 84 mg/dL (ref 70–99)
Potassium: 4.1 meq/L (ref 3.5–5.1)
Sodium: 140 meq/L (ref 135–145)

## 2024-01-20 DIAGNOSIS — I10 Essential (primary) hypertension: Secondary | ICD-10-CM | POA: Diagnosis not present

## 2024-02-20 DIAGNOSIS — I10 Essential (primary) hypertension: Secondary | ICD-10-CM | POA: Diagnosis not present

## 2024-03-08 ENCOUNTER — Ambulatory Visit: Admitting: Internal Medicine

## 2024-03-08 ENCOUNTER — Encounter: Payer: Self-pay | Admitting: Internal Medicine

## 2024-03-08 VITALS — BP 130/80 | HR 73 | Temp 98.2°F | Ht 66.0 in | Wt 145.5 lb

## 2024-03-08 DIAGNOSIS — E7800 Pure hypercholesterolemia, unspecified: Secondary | ICD-10-CM

## 2024-03-08 DIAGNOSIS — Z136 Encounter for screening for cardiovascular disorders: Secondary | ICD-10-CM

## 2024-03-08 DIAGNOSIS — Z860100 Personal history of colon polyps, unspecified: Secondary | ICD-10-CM

## 2024-03-08 DIAGNOSIS — I10 Essential (primary) hypertension: Secondary | ICD-10-CM | POA: Diagnosis not present

## 2024-03-08 DIAGNOSIS — F439 Reaction to severe stress, unspecified: Secondary | ICD-10-CM

## 2024-03-08 DIAGNOSIS — E538 Deficiency of other specified B group vitamins: Secondary | ICD-10-CM | POA: Diagnosis not present

## 2024-03-08 DIAGNOSIS — E0789 Other specified disorders of thyroid: Secondary | ICD-10-CM

## 2024-03-08 DIAGNOSIS — R21 Rash and other nonspecific skin eruption: Secondary | ICD-10-CM

## 2024-03-08 DIAGNOSIS — R739 Hyperglycemia, unspecified: Secondary | ICD-10-CM | POA: Diagnosis not present

## 2024-03-08 LAB — LIPID PANEL
Cholesterol: 181 mg/dL (ref 0–200)
HDL: 59.3 mg/dL (ref >39.00)
LDL Cholesterol: 106 mg/dL — ABNORMAL HIGH (ref 0–99)
NonHDL: 121.68
Total CHOL/HDL Ratio: 3
Triglycerides: 76 mg/dL (ref 0.0–149.0)
VLDL: 15.2 mg/dL (ref 0.0–40.0)

## 2024-03-08 LAB — HEPATIC FUNCTION PANEL
ALT: 19 U/L (ref 0–35)
AST: 21 U/L (ref 0–37)
Albumin: 4.5 g/dL (ref 3.5–5.2)
Alkaline Phosphatase: 77 U/L (ref 39–117)
Bilirubin, Direct: 0.1 mg/dL (ref 0.0–0.3)
Total Bilirubin: 0.4 mg/dL (ref 0.2–1.2)
Total Protein: 7.1 g/dL (ref 6.0–8.3)

## 2024-03-08 LAB — CBC WITH DIFFERENTIAL/PLATELET
Basophils Absolute: 0.1 K/uL (ref 0.0–0.1)
Basophils Relative: 1.9 % (ref 0.0–3.0)
Eosinophils Absolute: 0.1 K/uL (ref 0.0–0.7)
Eosinophils Relative: 2.4 % (ref 0.0–5.0)
HCT: 39.8 % (ref 36.0–46.0)
Hemoglobin: 13.1 g/dL (ref 12.0–15.0)
Lymphocytes Relative: 39.5 % (ref 12.0–46.0)
Lymphs Abs: 1.2 K/uL (ref 0.7–4.0)
MCHC: 32.9 g/dL (ref 30.0–36.0)
MCV: 79.9 fl (ref 78.0–100.0)
Monocytes Absolute: 0.2 K/uL (ref 0.1–1.0)
Monocytes Relative: 8.3 % (ref 3.0–12.0)
Neutro Abs: 1.4 K/uL (ref 1.4–7.7)
Neutrophils Relative %: 47.9 % (ref 43.0–77.0)
Platelets: 302 K/uL (ref 150.0–400.0)
RBC: 4.99 Mil/uL (ref 3.87–5.11)
RDW: 13.3 % (ref 11.5–15.5)
WBC: 3 K/uL — ABNORMAL LOW (ref 4.0–10.5)

## 2024-03-08 LAB — BASIC METABOLIC PANEL WITH GFR
BUN: 14 mg/dL (ref 6–23)
CO2: 29 meq/L (ref 19–32)
Calcium: 9.1 mg/dL (ref 8.4–10.5)
Chloride: 104 meq/L (ref 96–112)
Creatinine, Ser: 0.89 mg/dL (ref 0.40–1.20)
GFR: 67.35 mL/min (ref >60.00)
Glucose, Bld: 97 mg/dL (ref 70–99)
Potassium: 3.8 meq/L (ref 3.5–5.1)
Sodium: 141 meq/L (ref 135–145)

## 2024-03-08 LAB — VITAMIN B12: Vitamin B-12: 1350 pg/mL — ABNORMAL HIGH (ref 211–911)

## 2024-03-08 LAB — HEMOGLOBIN A1C: Hgb A1c MFr Bld: 6.2 % (ref 4.6–6.5)

## 2024-03-08 NOTE — Progress Notes (Signed)
 Subjective:    Patient ID: Cheryl Powers, female    DOB: 09-01-1955, 68 y.o.   MRN: [id number]  Patient here for  Chief Complaint  Patient presents with   Medical Management of Chronic Issues    4 mth f/u    HPI Here for a scheduled follow up - follow up regarding hypertension and hypercholesterolemia. Has been evaluated recently - increased stress. Discussed. Overall appears to be handling things relatively well. New job. Has adjusted her diet. Watching what she eats. Has cut out sodas. Decreased sugar. Decreased bread. Walking.  Needs f/u colonoscopy and thyroid  ultrasound. Discussed. Also discussed calcium score. Agreeable. Breathing stable. No increased cough or congestion. Does report persistent rash - posterior ear. Request referral to dermatology    Past Medical History:  Diagnosis Date   Dysrhythmia    PSVT   Hypercholesterolemia    Hypertension    Palpitations    SOBOE (shortness of breath on exertion)    Past Surgical History:  Procedure Laterality Date   CERVICAL POLYP REMOVAL  2003, 2009   COLONOSCOPY  2012   COLONOSCOPY WITH PROPOFOL  N/A 04/02/2018   Procedure: COLONOSCOPY WITH PROPOFOL ;  Surgeon: Viktoria Lamar DASEN, MD;  Location[facility] ENDOSCOPY;  Service: Endoscopy;  Laterality: N/A;   Family History  Problem Relation Age of Onset   Hypertension Father    Stroke Father 74   Heart attack Father    Diabetes Father        TYPE 2   Hyperlipidemia Father    Brain cancer Mother 56       Benign   Breast cancer Neg Hx    Social History   Socioeconomic History   Marital status: Married    Spouse name: Not on file   Number of children: 1   Years of education: Not on file   Highest education level: Not on file  Occupational History   Not on file  Tobacco Use   Smoking status: Never   Smokeless tobacco: Never  Vaping Use   Vaping status: Never Used  Substance and Sexual Activity   Alcohol use: Yes    Alcohol/week: 0.0 standard drinks of alcohol     Comment: glass of wine rarely   Drug use: No   Sexual activity: Yes    Partners: Male    Birth control/protection: Post-menopausal    Comment: Husband   Other Topics Concern   Not on file  Social History Narrative   Lives with husband    Daughter is grown and lives in [place]     Pets: None    Caffeine- No tea/coffee, dark chocolate only and occasionally    Right handed    Enjoys- HGT[employer]   Social Drivers of Health   Financial Resource Strain: Not on file  Food Insecurity: Not on file  Transportation Needs: Not on file  Physical Activity: Not on file  Stress: Not on file  Social Connections: Not on file     Review of Systems  Constitutional:  Negative for appetite change and unexpected weight change.  HENT:  Negative for congestion and sinus pressure.   Respiratory:  Negative for cough, chest tightness and shortness of breath.   Cardiovascular:  Negative for chest pain, palpitations and leg swelling.  Gastrointestinal:  Negative for abdominal pain, diarrhea, nausea and vomiting.  Genitourinary:  Negative for difficulty urinating and dysuria.  Musculoskeletal:  Negative for joint swelling and myalgias.  Skin:  Positive for rash. Negative for color change.  Neurological:  Negative for dizziness and headaches.  Psychiatric/Behavioral:  Negative for agitation and dysphoric mood.        Objective:     BP 130/80   Pulse 73   Temp 98.2 F (36.8 C) (Oral)   Ht 5' 6 (1.676 m)   Wt 145 lb 8 oz (66 kg)   LMP 07/07/2008 (Approximate)   SpO2 98%   BMI 23.48 kg/m  Wt Readings from Last 3 Encounters:  03/08/24 145 lb 8 oz (66 kg)  11/17/23 154 lb 6.4 oz (70 kg)  03/11/23 147 lb 9.6 oz (67 kg)    Physical Exam Vitals reviewed.  Constitutional:      General: She is not in acute distress.    Appearance: Normal appearance.  HENT:     Head: Normocephalic and atraumatic.     Right Ear: External ear normal.     Left Ear: External ear normal.     Mouth/Throat:     Pharynx: No  oropharyngeal exudate or posterior oropharyngeal erythema.  Eyes:     General: No scleral icterus.       Right eye: No discharge.        Left eye: No discharge.     Conjunctiva/sclera: Conjunctivae normal.  Neck:     Thyroid : No thyromegaly.  Cardiovascular:     Rate and Rhythm: Normal rate and regular rhythm.  Pulmonary:     Effort: No respiratory distress.     Breath sounds: Normal breath sounds. No wheezing.  Abdominal:     General: Bowel sounds are normal.     Palpations: Abdomen is soft.     Tenderness: There is no abdominal tenderness.  Musculoskeletal:        General: No swelling or tenderness.     Cervical back: Neck supple. No tenderness.  Lymphadenopathy:     Cervical: No cervical adenopathy.  Skin:    Findings: No erythema.     Comments: Rash - posterior ear. No increased erythema.   Neurological:     Mental Status: She is alert.  Psychiatric:        Mood and Affect: Mood normal.        Behavior: Behavior normal.         Outpatient Encounter Medications as of 03/08/2024  Medication Sig   Cyanocobalamin  (B-12-SL) 1000 MCG SUBL Place under the tongue.   Multiple Vitamins-Minerals (ALIVE MULTI-VITAMIN) CHEW Chew 1 each by mouth daily.   triamcinolone  cream (KENALOG ) 0.1 % Apply 1 Application topically daily. Avoid face, groin, underarms   TURMERIC PO Take by mouth.   No facility-administered encounter medications on file as of 03/08/2024.     Lab Results  Component Value Date   WBC 3.0 (L) 03/08/2024   HGB 13.1 03/08/2024   HCT 39.8 03/08/2024   PLT 302.0 03/08/2024   GLUCOSE 97 03/08/2024   CHOL 181 03/08/2024   TRIG 76.0 03/08/2024   HDL 59.30 03/08/2024   LDLDIRECT 138.3 07/07/2012   LDLCALC 106 (H) 03/08/2024   ALT 19 03/08/2024   AST 21 03/08/2024   NA 141 03/08/2024   K 3.8 03/08/2024   CL 104 03/08/2024   CREATININE 0.89 03/08/2024   BUN 14 03/08/2024   CO2 29 03/08/2024   TSH 1.44 12/10/2023   HGBA1C 6.2 03/08/2024    MM 3D  SCREENING MAMMOGRAM BILATERAL BREAST Result Date: 10/15/2023 CLINICAL DATA:  Screening. EXAM: DIGITAL SCREENING BILATERAL MAMMOGRAM WITH TOMOSYNTHESIS AND CAD TECHNIQUE: Bilateral screening digital craniocaudal and mediolateral oblique mammograms were obtained. Bilateral  screening digital breast tomosynthesis was performed. The images were evaluated with computer-aided detection. COMPARISON:  Previous exam(s). ACR Breast Density Category b: There are scattered areas of fibroglandular density. FINDINGS: There are no findings suspicious for malignancy. IMPRESSION: No mammographic evidence of malignancy. A result letter of this screening mammogram will be mailed directly to the patient. RECOMMENDATION: Screening mammogram in one year. (Code:SM-B-01Y) BI-RADS CATEGORY  1: Negative. Electronically Signed   By: Toribio Agreste M.D.   On: 10/15/2023 11:39       Assessment & Plan:  Hypercholesterolemia Assessment & Plan: Low cholesterol diet and exercise. Discussed calcium score. Agreeable. Check lipid panel today.  Lab Results  Component Value Date   CHOL 181 03/08/2024   HDL 59.30 03/08/2024   LDLCALC 106 (H) 03/08/2024   LDLDIRECT 138.3 07/07/2012   TRIG 76.0 03/08/2024   CHOLHDL 3 03/08/2024     Orders: -     Basic metabolic panel with GFR -     Hepatic function panel -     Lipid panel  Essential hypertension, benign Assessment & Plan: Blood pressure as outlined. Desires no medication. Follow pressures. Follow metabolic panel.   Orders: -     Basic metabolic panel with GFR  Hyperglycemia Assessment & Plan: Low carb diet and exercise. Follow met b and A1c.   Orders: -     Basic metabolic panel with GFR -     Hemoglobin A1c  B12 deficiency -     CBC with Differential/Platelet -     Vitamin B12  Thyroid  fullness Assessment & Plan: Had thyroid  ultrasound 10/2020 - multiple nodules as outlined. Given slight increase and one nodule 1.7cm - discussed referral to endocrinology for  further evaluation.  Has previously declined.  Discussed f/u thyroid  ultrasound/f/u endocrinology.    Rash Assessment & Plan: Persistent rash posterior ear. Request referral to dermatology.   Orders: -     Ambulatory referral to Dermatology  Encounter for screening for coronary artery disease Assessment & Plan: Discussed. CT calcium score ordered.   Orders: -     CT CARDIAC SCORING (SELF PAY ONLY); Future  Stress Assessment & Plan: Overall appears to be handling things relatively well. Follow.    History of colonic polyps Assessment & Plan: Colonoscopy 03/2018.  Recommended f/u in 5 years. Referred to GI previous visit. Canceled colonoscopy. Discussed today regarding rescheduling. She will reschedule.       Allena Hamilton, MD

## 2024-03-09 ENCOUNTER — Ambulatory Visit: Payer: Self-pay | Admitting: Internal Medicine

## 2024-03-09 NOTE — Telephone Encounter (Signed)
 Copied from CRM #[id number]. Topic: Clinical - Lab/Test Results >> Mar 09, 2024  3:49 PM Ashley R wrote: Reason for CRM: callback requested to discuss lab results. Now in a good location to talk. [phone]

## 2024-03-17 ENCOUNTER — Encounter: Payer: Self-pay | Admitting: Internal Medicine

## 2024-03-17 NOTE — Assessment & Plan Note (Signed)
 Discussed. CT calcium score ordered.

## 2024-03-17 NOTE — Assessment & Plan Note (Signed)
 Colonoscopy 03/2018.  Recommended f/u in 5 years. Referred to GI previous visit. Canceled colonoscopy. Discussed today regarding rescheduling. She will reschedule.

## 2024-03-17 NOTE — Assessment & Plan Note (Signed)
 Persistent rash posterior ear. Request referral to dermatology.

## 2024-03-17 NOTE — Assessment & Plan Note (Signed)
 Low-carb diet and exercise.  Follow met b and A1c.

## 2024-03-17 NOTE — Assessment & Plan Note (Signed)
 Had thyroid  ultrasound 10/2020 - multiple nodules as outlined. Given slight increase and one nodule 1.7cm - discussed referral to endocrinology for further evaluation.  Has previously declined.  Discussed f/u thyroid  ultrasound/f/u endocrinology.

## 2024-03-17 NOTE — Assessment & Plan Note (Signed)
 Low cholesterol diet and exercise. Discussed calcium score. Agreeable. Check lipid panel today.  Lab Results  Component Value Date   CHOL 181 03/08/2024   HDL 59.30 03/08/2024   LDLCALC 106 (H) 03/08/2024   LDLDIRECT 138.3 07/07/2012   TRIG 76.0 03/08/2024   CHOLHDL 3 03/08/2024

## 2024-03-17 NOTE — Assessment & Plan Note (Signed)
Overall appears to be handling things relatively well.  Follow.   

## 2024-03-17 NOTE — Assessment & Plan Note (Signed)
 Blood pressure as outlined. Desires no medication. Follow pressures. Follow metabolic panel.

## 2024-03-24 ENCOUNTER — Other Ambulatory Visit: Payer: Self-pay

## 2024-03-24 ENCOUNTER — Ambulatory Visit

## 2024-03-24 ENCOUNTER — Ambulatory Visit
Admission: RE | Admit: 2024-03-24 | Discharge: 2024-03-24 | Disposition: A | Payer: Self-pay | Source: Ambulatory Visit | Attending: Internal Medicine | Admitting: Internal Medicine

## 2024-03-24 DIAGNOSIS — L989 Disorder of the skin and subcutaneous tissue, unspecified: Secondary | ICD-10-CM | POA: Diagnosis not present

## 2024-03-24 DIAGNOSIS — M67471 Ganglion, right ankle and foot: Secondary | ICD-10-CM | POA: Diagnosis not present

## 2024-03-24 DIAGNOSIS — L649 Androgenic alopecia, unspecified: Secondary | ICD-10-CM

## 2024-03-24 DIAGNOSIS — D2372 Other benign neoplasm of skin of left lower limb, including hip: Secondary | ICD-10-CM

## 2024-03-24 DIAGNOSIS — Z136 Encounter for screening for cardiovascular disorders: Secondary | ICD-10-CM

## 2024-03-24 DIAGNOSIS — I868 Varicose veins of other specified sites: Secondary | ICD-10-CM

## 2024-03-24 DIAGNOSIS — L219 Seborrheic dermatitis, unspecified: Secondary | ICD-10-CM

## 2024-03-24 DIAGNOSIS — D239 Other benign neoplasm of skin, unspecified: Secondary | ICD-10-CM

## 2024-03-24 DIAGNOSIS — M67449 Ganglion, unspecified hand: Secondary | ICD-10-CM

## 2024-03-24 MED ORDER — CLOBETASOL PROPIONATE 0.05 % EX SOLN: CUTANEOUS | 5 refills | Status: AC

## 2024-03-24 MED ORDER — KETOCONAZOLE 2 % EX SHAM: MEDICATED_SHAMPOO | CUTANEOUS | 5 refills | Status: AC

## 2024-03-24 MED ORDER — KETOCONAZOLE 2 % EX CREA: TOPICAL_CREAM | CUTANEOUS | 5 refills | Status: DC

## 2024-03-24 MED ORDER — TACROLIMUS 0.1 % EX OINT: TOPICAL_OINTMENT | CUTANEOUS | 5 refills | Status: AC

## 2024-03-24 NOTE — Progress Notes (Signed)
 Subjective   Cheryl Powers is a 68 y.o. female who presents for the following: Rash. Patient is established patient .  Today patient reports: Patient states rash behind right ear, occurring for several months use Aquaphor, does improve tried neosporin did not help.  Discuss scalp issue, loss of hair saw Dr. Hester. She got no prescriptions due to wanting to use natural products. Using Mielle shampoo and conditioner and daily spritz. Varicose veins left thigh, right thumb pigmentation, raised bump on fourth right toe occurring for about a year, does not bother.  Review of Systems:    No other skin or systemic complaints except as noted in HPI or Assessment and Plan.  The following portions of the chart were reviewed this encounter and updated as appropriate: medications, allergies, medical history  Relevant Medical History:  n/a   Objective  (SKPE) Well appearing patient in no apparent distress; mood and affect are within normal limits. Examination was performed of the: Focused Exam of: R ear, R foot, scalp, bilateral thighs.   Examination notable for: - Erythema and scaling of the scalp, ear canals, and central face > rest of face. - 4 mm blue translucent papule at right fourth toe.    - dermatofibroma of L leg - spider veins, varicosity of L leg  Examination limited by: Undergarments, Shoes or socks , and Clothing     Assessment & Plan  (SKAP)   Seborrheic dermatitis vs sebopsoriasis Chronic and persistent condition with duration or expected duration over one year. Condition is symptomatic and bothersome to patient. Patient is flaring and not currently at treatment goal.  - Discussed diagnosis, typical course, and treatment options for this condition - Explained to the patient the chronic nature of this diagnosis - Start ketoconazole 2% shampoo TIW, apply to scalp and affected areas of face/body and allow the shampoo to sit for 5-10 minutes before rinsing off - Consider  alternating with use of Head and Shoulders or [place] Blue anti-dandruff shampoo which contains zinc pyrithione 1% - Start clobetasol solution 0.05% twice daily to affected skin - scalp  - Discussed side effect of super potent topical steroids including atrophy, dyspigmentation, striae, telangectasia, folliculitis, loss of skin pigment, hair growth, tachyphylaxis, risk of systemic absorption with missuse.  Start tacrolimus ointment 0.1% twice daily on ears Educated about black box warning (and also that recent studies show no increased malignancy risk) and common adverse effects such as burning with application (advised to cool in fridge and only apply to bone-dry skin). Given location of eruption, unsafe to use daily topical CS to area. Patient requires topical calcineurin inhibitor given high risk of dyspigmentation and atrophy from topical CS use in sensitive area.  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 Condition is symptomatic and bothersome to patient. Patient is flaring and not currently at treatment goal.  - Discussed the nature of this condition that will progress over time - Detailed discussion about therapeutic options including use of finasteride, dutasteride and oral minoxidil, topical minoxidil. Patient prefers more natural options - will look into nutrafol. Discussed risk of high levels of biotin in nutrafol    Favor digital mucous cyst R 4th toe  - Explained to patient this most likely is consistent with a digital mucous cyst - Benign, patient reassured. - discussed if bothersome would refer to ortho for removal   Dermatofibroma - Reassured the patient that this is a benign scar like reaction that can  occur spontaneously or after trauma.  - Lesions are asymptomatic, therefore no treatment is required.  - Instructed to let us  know if lesions become painful or bothersome.  Varicose Veins - Discussed if  patient would want treatment would refer to vein & vascular specialist.   Level of service outlined above   Patient instructions (SKPI)   Procedures, orders, diagnosis for this visit:  SEBORRHEIC DERMATITIS   ANDROGENETIC ALOPECIA   SPIDER VARICOSE VEIN   DIGITAL MUCOUS CYST   DERMATOFIBROMA OF LEFT LOWER LEG    Seborrheic dermatitis  Androgenetic alopecia  Spider varicose vein  Digital mucous cyst  Dermatofibroma of left lower leg  Other orders -     Ketoconazole; Apply to scalp as a shampoo 2-3 times weekly. Let sit for 5 minutes before rinsing.  Dispense: 120 mL; Refill: 5 -     Ketoconazole; Apply to affected area of skin twice daily  Dispense: 30 g; Refill: 5 -     Clobetasol Propionate; Apply 1 mL to affected areas of skin twice daily  Dispense: 50 mL; Refill: 5 -     Tacrolimus; Apply 2 grams twice daily to affected areas of skin  Dispense: 60 g; Refill: 5    Return to clinic: Return for TBSE , w/ Dr. Raymund.  I, Almetta Nora, RMA, am acting as scribe for Lauraine JAYSON Raymund, MD .   Documentation: I have reviewed the above documentation for accuracy and completeness, and I agree with the above.  Lauraine JAYSON Raymund, MD

## 2024-03-24 NOTE — Patient Instructions (Addendum)
 Sunscreen  Who needs sunscreen? Everyone. Sunscreen use can help prevent skin cancer by protecting you from the sun's harmful ultraviolet rays. Anyone can get skin cancer, regardless of age, gender or race. In fact, it is estimated that one in five Americans will develop skin cancer in their lifetime.  Sunscreen alone cannot fully protect you. In addition to wearing sunscreen, dermatologists recommend taking the following steps to protect your skin and find skin cancer early:  Seek shade when appropriate, remembering that the sun's rays are strongest between 10 a.m. and 2 p.m. If your shadow is shorter than you are, seek shade. Dress to protect yourself from the sun by wearing a lightweight long-sleeved shirt, pants, a wide-brimmed hat and sunglasses, when possible.  Use extra caution near water, snow and sand as they reflect the damaging rays of the sun, which can increase your chance of sunburn.  Get vitamin D safely through a healthy diet that may include vitamin supplements. Don't seek the sun. Avoid tanning beds. Ultraviolet light from the sun and tanning beds can cause skin cancer and wrinkling. If you want to look tan, you may wish to use a self-tanning product, but continue to use sunscreen with it.  When should I use sunscreen? Every day you go outside--even if you're just walking to and from your form of transportation. The sun emits harmful UV rays year-round. Even on cloudy days, up to 80 percent of the sun's harmful UV rays can penetrate your skin. Snow, sand and water increase the need for sunscreen because they reflect the sun's rays.  How much sunscreen should I use, and how often should I apply it? Most people only apply 25-50 percent of the recommended amount of sunscreen. Apply enough sunscreen to cover all exposed skin. Most adults need about 1 ounce -- or enough to fill a shot glass -- to fully cover their body.  Don't forget to apply to the tops of your feet, your neck, your ears  and the top of your head. Apply sunscreen to dry skin 15 minutes before going outdoors.  Skin cancer also can form on the lips. To protect your lips, apply a lip balm or lipstick that contains sunscreen with an SPF of 30 or higher.  When outdoors, reapply sunscreen approximately every two hours, or after swimming or sweating, according to the directions on the bottle.   Broad-spectrum sunscreens protect against both UVA and UVB rays. What is the difference between the rays? Sunlight consists of two types of harmful rays that reach the earth -- UVA rays and UVB rays. Overexposure to either can lead to skin cancer. In addition to causing skin cancer, here's what each of these rays do:  UVA rays (or aging rays) can prematurely age your skin, causing wrinkles and age spots, and can pass through window glass. UVB rays (or burning rays) are the primary cause of sunburn and are blocked by window glass  There is no safe way to tan. Every time you tan, you damage your skin. As this damage builds, you speed up the aging of your skin and increase your risk for all types of skin cancer.  What is the difference between chemical and physical sunscreens? Chemical sunscreens work like a sponge, absorbing the sun's rays. They contain one or more of the following active ingredients: oxybenzone, avobenzone, octisalate, octocrylene, homosalate and octinoxate. These formulations tend to be easier to rub into the skin without leaving a white residue.   Physical sunscreens work like a shield,  sitting sit on the surface of your skin and deflecting the sun's rays. They contain the active ingredients zinc oxide and/or titanium dioxide. Use this sunscreen if you have sensitive skin.   What type of sunscreen should I use? The best type of sunscreen is the one you will use again and again. Just make sure it offers broad-spectrum (UVA and UVB) protection, has an SPF of 30+, and is water-resistant. The kind of sunscreen you use is  a matter of personal choice, and may vary depending on the area of the body to be protected. Available sunscreen options include lotions, creams, gels, ointments, wax sticks and sprays.  Recommended physical sunscreens for face: - Neutrogena Sheer Zinc - Aveeno Positively Mineral Sensitive - CeraVe Hydrating Mineral (also has a tinted version) - La Roche-Posay Anthelios Mineral Face (comes as a cream, lotion, light fluid, and there is also a tinted version).  - EltaMD UV Clear (also has a tinted version)  Recommended physical sunscreens for body: - Neutrogena Sheer Zinc Dry-Touch Sunscreen Sensitive Skin Lotion Broad Spectrum SPF 50 - Aveeno Positively Mineral Sensitive Skin Sunscreen Broad Spectrum SPF 50 - La Roche-Posay Anthelios SPF 50 Mineral Sunscreen - Gentle Lotion - CeraVe Hydrating Mineral Sunscreen SPF 50  Recommended chemical sunscreens for face: - Anthelios UV Correct Face Sunscreen SPF 70 with Niacinamide - Neutrogena Clear Face Oil-Free SPF 50 with Helioplex - Neutrogena Sport Face Oil-Free SPF 70+ with Helioplex - Aveeno Protect + Hydrate Sunscreen For Face SPF 70 - La Roche-Posay Anthelios Light Fluid Sunscreen for Face SPF 60  Recommended chemical sunscreens for body: - Neutrogena Ultra Sheer Dry-Touch Sunscreen SPF 70 - Aveeno Protect + Hydrate Broad Spectrum All-Day Hydration SPF 60 (comes in a big pump) - La Roche-Posay Anthelios Melt-In Milk Sunscreen SPF 60   Due to recent changes in healthcare laws, you may see results of your pathology and/or laboratory studies on MyChart before the doctors have had a chance to review them. We understand that in some cases there may be results that are confusing or concerning to you. Please understand that not all results are received at the same time and often the doctors may need to interpret multiple results in order to provide you with the best plan of care or course of treatment. Therefore, we ask that you please give us  2  business days to thoroughly review all your results before contacting the office for clarification. Should we see a critical lab result, you will be contacted sooner.   If You Need Anything After Your Visit  If you have any questions or concerns for your doctor, please call our main line at [phone] and press option 4 to reach your doctor's medical assistant. If no one answers, please leave a voicemail as directed and we will return your call as soon as possible. Messages left after 4 pm will be answered the following business day.   You may also send us  a message via MyChart. We typically respond to MyChart messages within 1-2 business days.  For prescription refills, please ask your pharmacy to contact our office. Our fax number is [phone].  If you have an urgent issue when the clinic is closed that cannot wait until the next business day, you can page your doctor at the number below.    Please note that while we do our best to be available for urgent issues outside of office hours, we are not available 24/7.   If you have an urgent issue and are unable  to reach us , you may choose to seek medical care at your doctor's office, retail clinic, urgent care center, or emergency room.  If you have a medical emergency, please immediately call 911 or go to the emergency department.  Pager Numbers  - Dr. Hester: [phone]  - Dr. Jackquline: [phone]  - Dr. Claudene: [phone]   - Dr. Raymund: [phone]  In the event of inclement weather, please call our main line at [phone] for an update on the status of any delays or closures.  Dermatology Medication Tips: Please keep the boxes that topical medications come in in order to help keep track of the instructions about where and how to use these. Pharmacies typically print the medication instructions only on the boxes and not directly on the medication tubes.   If your medication is too expensive, please contact our office  at [phone] option 4 or send us  a message through MyChart.   We are unable to tell what your co-pay for medications will be in advance as this is different depending on your insurance coverage. However, we may be able to find a substitute medication at lower cost or fill out paperwork to get insurance to cover a needed medication.   If a prior authorization is required to get your medication covered by your insurance company, please allow us  1-2 business days to complete this process.  Drug prices often vary depending on where the prescription is filled and some pharmacies may offer cheaper prices.  The website www.goodrx.com contains coupons for medications through different pharmacies. The prices here do not account for what the cost may be with help from insurance (it may be cheaper with your insurance), but the website can give you the price if you did not use any insurance.  - You can print the associated coupon and take it with your prescription to the pharmacy.  - You may also stop by our office during regular business hours and pick up a GoodRx coupon card.  - If you need your prescription sent electronically to a different pharmacy, notify our office through [facility] or by phone at [phone] option 4.     Si Usted Necesita Algo Despus de Su Visita  Tambin puede enviarnos un mensaje a travs de Clinical cytogeneticist. Por lo general respondemos a los mensajes de MyChart en el transcurso de 1 a 2 das hbiles.  Para renovar recetas, por favor pida a su farmacia que se ponga en contacto con nuestra oficina. Randi lakes de fax es [place] [phone].  Si tiene un asunto urgente cuando la clnica est cerrada y que no puede esperar hasta el siguiente da hbil, puede llamar/localizar a su doctor(a) al nmero que aparece a continuacin.   Por favor, tenga en cuenta que aunque hacemos todo lo posible para estar disponibles para asuntos urgentes fuera del horario de [place], no estamos  disponibles las 24 horas del da, los 7 [address]  [address] de la [place].   Si tiene un problema urgente y no puede comunicarse con nosotros, puede optar por buscar atencin mdica  en el consultorio de su doctor(a), en una clnica privada, en un centro de atencin urgente o en una sala de emergencias.  Si tiene Engineer, drilling, por favor llame inmediatamente al 911 o vaya a la sala de emergencias.  Nmeros de bper  - Dr. Hester: [phone]  - Dra. Jackquline: [phone]  - Dr. Claudene: [phone]  - Dra. Kitts: [phone]  En caso de inclemencias del tiempo, por favor llame a nuestra  lnea principal al [phone] para una actualizacin sobre el [place] de cualquier retraso o cierre.  Consejos para la medicacin en dermatologa: Por favor, guarde las cajas en las que vienen los medicamentos de uso tpico para ayudarle a seguir las instrucciones sobre dnde y cmo usarlos. Las farmacias generalmente imprimen las instrucciones del medicamento slo en las cajas y no directamente en los tubos del [place].   Si su medicamento es muy caro, por favor, pngase en contacto con landry rieger llamando al [phone] y presione la opcin 4 o envenos un mensaje a travs de Clinical cytogeneticist.   No podemos decirle cul ser su copago por los medicamentos por adelantado ya que esto es diferente dependiendo de la cobertura de su seguro. Sin embargo, es posible que podamos encontrar un medicamento sustituto a Audiological scientist un formulario para que el seguro cubra el medicamento que se considera necesario.   Si se requiere una autorizacin previa para que su compaa de seguros malta su medicamento, por favor permtanos de 1 a 2 das hbiles para completar este proceso.  Los precios de los medicamentos varan con frecuencia dependiendo del Environmental consultant de dnde se surte la receta y alguna farmacias pueden ofrecer precios ms baratos.  El sitio web www.goodrx.com tiene cupones para medicamentos de Engineer, civil (consulting). Los precios aqu no tienen en cuenta lo que podra costar con la ayuda del seguro (puede ser ms barato con su seguro), pero el sitio web puede darle el precio si no utiliz Tourist information centre manager.  - Puede imprimir el cupn correspondiente y llevarlo con su receta a la farmacia.  - Tambin puede pasar por nuestra oficina durante el horario de atencin regular y Education officer, museum una tarjeta de cupones de GoodRx.  - Si necesita que su receta se enve electrnicamente a una farmacia diferente, informe a nuestra oficina a travs de MyChart de [place] o por telfono llamando al [phone] y presione la opcin 4.

## 2024-05-05 ENCOUNTER — Ambulatory Visit

## 2024-06-28 ENCOUNTER — Encounter: Admitting: Internal Medicine

## 2024-07-12 ENCOUNTER — Ambulatory Visit: Admitting: Dermatology
